# Patient Record
Sex: Female | Born: 1960 | Race: White | Hispanic: No | Marital: Married | Smoking: Never smoker
Health system: Southern US, Academic
[De-identification: ages and names within clinical notes are randomized; demographics above are authoritative.]

## PROBLEM LIST (undated history)

## (undated) DIAGNOSIS — K219 Gastro-esophageal reflux disease without esophagitis: Secondary | ICD-10-CM

## (undated) DIAGNOSIS — F419 Anxiety disorder, unspecified: Secondary | ICD-10-CM

## (undated) DIAGNOSIS — M8580 Other specified disorders of bone density and structure, unspecified site: Secondary | ICD-10-CM

## (undated) HISTORY — PX: HX BREAST BIOPSY: SHX20

## (undated) HISTORY — DX: Gastro-esophageal reflux disease without esophagitis: K21.9

## (undated) HISTORY — DX: Anxiety disorder, unspecified: F41.9

## (undated) HISTORY — DX: Other specified disorders of bone density and structure, unspecified site: M85.80

## (undated) HISTORY — PX: DILATION AND CURETTAGE OF UTERUS: SHX78

## (undated) HISTORY — PX: OTHER SURGICAL HISTORY: SHX169

---

## 1998-05-07 ENCOUNTER — Other Ambulatory Visit: Admission: RE | Admit: 1998-05-07 | Discharge: 1998-05-07 | Payer: Self-pay | Admitting: Gynecology

## 1999-06-02 ENCOUNTER — Other Ambulatory Visit: Admission: RE | Admit: 1999-06-02 | Discharge: 1999-06-02 | Payer: Self-pay | Admitting: Gynecology

## 1999-10-23 ENCOUNTER — Encounter: Payer: Self-pay | Admitting: Gynecology

## 1999-10-23 ENCOUNTER — Encounter: Admission: RE | Admit: 1999-10-23 | Discharge: 1999-10-23 | Payer: Self-pay | Admitting: Gynecology

## 2000-08-02 ENCOUNTER — Other Ambulatory Visit: Admission: RE | Admit: 2000-08-02 | Discharge: 2000-08-02 | Payer: Self-pay | Admitting: Gynecology

## 2001-08-31 ENCOUNTER — Other Ambulatory Visit: Admission: RE | Admit: 2001-08-31 | Discharge: 2001-08-31 | Payer: Self-pay | Admitting: Gynecology

## 2001-09-28 ENCOUNTER — Ambulatory Visit (HOSPITAL_COMMUNITY): Admission: RE | Admit: 2001-09-28 | Discharge: 2001-09-28 | Payer: Self-pay | Admitting: *Deleted

## 2001-09-28 ENCOUNTER — Encounter (INDEPENDENT_AMBULATORY_CARE_PROVIDER_SITE_OTHER): Payer: Self-pay | Admitting: *Deleted

## 2002-09-04 ENCOUNTER — Other Ambulatory Visit: Admission: RE | Admit: 2002-09-04 | Discharge: 2002-09-04 | Payer: Self-pay | Admitting: Gynecology

## 2003-11-19 ENCOUNTER — Other Ambulatory Visit: Admission: RE | Admit: 2003-11-19 | Discharge: 2003-11-19 | Payer: Self-pay | Admitting: Gynecology

## 2005-01-13 ENCOUNTER — Other Ambulatory Visit: Admission: RE | Admit: 2005-01-13 | Discharge: 2005-01-13 | Payer: Self-pay | Admitting: Gynecology

## 2005-06-23 ENCOUNTER — Encounter: Admission: RE | Admit: 2005-06-23 | Discharge: 2005-06-23 | Payer: Self-pay | Admitting: Family Medicine

## 2006-03-14 ENCOUNTER — Other Ambulatory Visit: Admission: RE | Admit: 2006-03-14 | Discharge: 2006-03-14 | Payer: Self-pay | Admitting: Gynecology

## 2008-04-04 ENCOUNTER — Other Ambulatory Visit: Admission: RE | Admit: 2008-04-04 | Discharge: 2008-04-04 | Payer: Self-pay | Admitting: Gynecology

## 2008-05-14 ENCOUNTER — Encounter: Payer: Self-pay | Admitting: Gynecology

## 2008-05-14 ENCOUNTER — Ambulatory Visit (HOSPITAL_BASED_OUTPATIENT_CLINIC_OR_DEPARTMENT_OTHER): Admission: RE | Admit: 2008-05-14 | Discharge: 2008-05-14 | Payer: Self-pay | Admitting: Gynecology

## 2008-05-14 HISTORY — PX: OTHER SURGICAL HISTORY: SHX169

## 2008-08-15 ENCOUNTER — Ambulatory Visit: Payer: Self-pay | Admitting: Gynecology

## 2010-01-14 ENCOUNTER — Ambulatory Visit: Payer: Self-pay | Admitting: Gynecology

## 2010-01-14 ENCOUNTER — Other Ambulatory Visit: Admission: RE | Admit: 2010-01-14 | Discharge: 2010-01-14 | Payer: Self-pay | Admitting: Gynecology

## 2010-07-02 ENCOUNTER — Encounter: Admission: RE | Admit: 2010-07-02 | Discharge: 2010-07-02 | Payer: Self-pay | Admitting: Internal Medicine

## 2010-07-23 ENCOUNTER — Ambulatory Visit (HOSPITAL_COMMUNITY): Admission: RE | Admit: 2010-07-23 | Discharge: 2010-07-23 | Payer: Self-pay | Admitting: Internal Medicine

## 2010-11-28 ENCOUNTER — Other Ambulatory Visit: Payer: Self-pay | Admitting: Internal Medicine

## 2010-11-28 DIAGNOSIS — N281 Cyst of kidney, acquired: Secondary | ICD-10-CM

## 2010-12-15 ENCOUNTER — Other Ambulatory Visit: Payer: Self-pay

## 2010-12-17 ENCOUNTER — Ambulatory Visit
Admission: RE | Admit: 2010-12-17 | Discharge: 2010-12-17 | Disposition: A | Discharge status: Home / Self Care | Payer: BC Managed Care – PPO | Source: Ambulatory Visit | Attending: Internal Medicine | Admitting: Internal Medicine

## 2010-12-17 DIAGNOSIS — N281 Cyst of kidney, acquired: Secondary | ICD-10-CM

## 2011-03-23 NOTE — Op Note (Signed)
Marie Robinson, Marie Robinson                 ACCOUNT NO.:  [id number]   MEDICAL RECORD NO.:  [id number]          PATIENT TYPE:  AMB   LOCATION:  [facility]                         FACILITY:  [facility]   PHYSICIAN:  Juan H. Lily Peer, M.D.DATE OF BIRTH:  1961/10/01   DATE OF PROCEDURE:  DATE OF DISCHARGE:                               OPERATIVE REPORT   INDICATIONS FOR OPERATION:  A 50 year old gravida 4, para 2, AB 2.   Routine ultrasound.  Was found to have intrauterine defect.  Sonohysterogram demonstrated what appeared to be either submucous myoma  or submucous endometrial polyp.   PREOPERATIVE DIAGNOSIS:  Submucous myoma/endometrial polyp.   POSTOPERATIVE DIAGNOSIS:  Endometrial polyp.   ANESTHESIA:  General endotracheal anesthesia.   PROCEDURE PERFORMED:  Resectoscopic polypectomy.   FINDINGS:  Normal cervical length with no intracervical lesions.  Both  tubal ostia were identified.  A small polyp was noted in the left  cornual region.  No other intracavitary defects were noted.   DESCRIPTION OF OPERATION:  After the patient was adequately counseled,  she was taken to the operating room where she underwent successful  general endotracheal anesthesia.  She had received cefoxitin 1 gram for  prophylaxis and also PSA stockings for DVT prophylaxis.  She was placed  ina low lithotomy position, laminaria that had previously been placed  intracervical was removed.  The cervix and the vagina was cleansed with  Betadine solution and the uterus sounded to approximately 7 cm.  Minimal  dilatation was required with a Pratt dilator due to the fact that she  had a laminaria placed night before.  The Karl/Storz operative  resectoscope with 9 degrees wire loop was inserted into the intrauterine  cavity 3% sorbitol with distending media.  After systematic inspection  of the intrauterine cavity and cervical canal with findings as described  above, the Valleylab electrical surgical generator was set at 70  watts  in the cutting mode and 70 anticoagulation mode and the endometrial  polyp had been identified in the left cornual region was excised at its  base and passed off the operative field for histological evaluation.  No  other abnormality was noted.  The single-toothed tenaculum was removed.  The patient was extubated and transferred to recovery with stable vital  signs.  Blood loss was minimal.  Fluid resuscitation consisted of 100 mL  of lactated Ringer's and 3% sorbitol fluid deficit was less than 50 mL.      Juan H. Lily Peer, M.D.  Electronically Signed     JHF/MEDQ  D:  05/14/2008  T:  05/14/2008  Job:  [id number]

## 2011-03-23 NOTE — H&P (Signed)
[name], Robinson                 ACCOUNT NO.:  [id number]   MEDICAL RECORD NO.:  [id number]         PATIENT TYPE:  HAMB   LOCATION:                               FACILITY:  [facility]   PHYSICIAN:  Juan H. Lily Peer, M.D.DATE OF BIRTH:  Aug 24, 1961   DATE OF ADMISSION:  05/14/2008  DATE OF DISCHARGE:                              HISTORY & PHYSICAL   The patient is scheduled for surgery on Tuesday, July 7, at 7:30 a.m. in  [facility].  Please have history and physical available.   CHIEF COMPLAINT:  Submucous myoma.   HISTORY:  The patient is a 50 year old gravida 4, para 2, AB2 was seen  in the office for annual gynecological examination on May 28.  The  patient had been menopausal since 2007, but was denying any vasomotor  symptoms.  She was on no hormone replacement therapy.  Her mother had  uterine cancer at the age of 54 as well as breast cancer.  The patient  had been complaining recently of some on and off left lower quadrant  discomfort and since she weighs 209 pounds, we had proceed with even an  ultrasound in the office.  The ultrasound had demonstrated a retroflexed  uterus difficult to assess her adnexa of the images located in the wall  of the endometrial cavity was questionable solid myoma with some  calcification measuring 13 mm x 9 mm.  We had also proceed with a  sonohysterogram to confirm that it was a submucous myoma and was  measuring 11 mm x 10 mm.  An endometrial biopsy had been done as well  which had demonstrated fragments of benign endometrial polyp.  The  patient is now scheduled to undergo resectoscopic polypectomy and/or  myomectomy for histological evaluation.  The patient denied any vaginal  bleeding, review of record also indicated that when she came for her  annual exam, vitamin D level had been obtained which had demonstrated  her value to be low at 27.  She will be placed on vitamin D 50,000 units  q. weekly for 12 weeks.  We will recheck  her vitamin D level a week  after her last vitamin D tablet and her recent Pap smear had been normal  as well and her mammogram had some fatty tissue obscuring some of the  views and she was asked to follow up with a mammogram in 6 months  especially of the right breast.   PAST MEDICAL HISTORY:  She has had two normal spontaneous vaginal  deliveries, two D & Cs.   MEDICATIONS:  Consist of multivitamins, calcium, Nexium, Lexapro 20 mg  daily, and Wellbutrin XR 150 mg q. daily.   MEDICAL CONDITIONS:  Gastroesophageal reflux disease and anxiety.   ALLERGIES:  She denies any allergies.   FAMILY HISTORY:  Mother and grandmother, history of diabetes and mother  with hypertension, mother at the age of 52 had breast cancer.  Mother  and grandmother, history of osteoporosis and uterine cancer described in  her mother at the age of 52.  The patient's form of contraception had  been a vasectomy that her husband had many years ago.   PHYSICAL EXAMINATION:  VITAL SIGNS: On physical examination, the patient  weighs 209 pounds and blood pressure was 142/86.  She is 5 feet 5-1/2  inches tall.  HEENT: Unremarkable.  NECK: Supple.  Trachea midline.  No carotid bruits or thyromegaly.  LUNGS: Clear to auscultation without rhonchi or wheezes.  HEART: Regular rate and rhythm.  No murmurs or gallop.  BREASTS: Not done today.  Her annual examination was normal.  ABDOMEN: Soft, nontender without rebound or guarding.  PELVIC: Bartholin, urethra, and Skene glands were within normal limits.  VAGINA AND CERVIX: No lesion or discharge.  UTERUS AND ADNEXA: Difficult to evaluate due to the patient's abdominal  girth and weight of 209 pounds.  This was the reason for the ultrasound  having been done to begin with, see history above.  RECTAL EXAM: Unremarkable.   ASSESSMENT:  A 50 year old gravida 4, para 2, AB2 with an incidental  finding of submucous polyp/myoma at the time of assessing her adnexa for   pelvic pain.  The pain is resolved.  The ultrasound demonstrated normal-  appearing ovaries but a submucous polyp/myoma.  Her endometrial biopsy  had demonstrated fragments of benign endometrial polyp.  The patient  will undergo resectoscopic polypectomy/myomectomy on Tuesday, July 7.  Today, she was seen in the office for preoperative consultation and also  had a laminaria placed intracervically to facilitate insertion of the  operative resectoscope at the time of surgery tomorrow.  The risk of the  operation include infection, which she will receive prophylactic  antibiotic.  For DVT prophylaxis, she will have PSA stocking.  We  discussed about the potential risk for fluid overload and pulmonary  edema or perforation of the uterus from the operative resectoscope  requiring emergency surgery.  Also, the event of uncontrollable  hemorrhages, she would need a blood transfusion, the risk of  anaphylactic reactions, hepatitis, and AIDS.  All these issues were  discussed in detail.  All questions were answered and we will follow  accordingly.   PLAN:  The patient is scheduled for resectoscopic polypectomy/myomectomy  on Tuesday, July 7, at [facility].  Please have history  and physical available.      Juan H. Lily Peer, M.D.  Electronically Signed     JHF/MEDQ  D:  05/13/2008  T:  05/13/2008  Job:  [id number]

## 2011-05-28 ENCOUNTER — Encounter: Payer: BC Managed Care – PPO | Admitting: Gynecology

## 2011-06-01 ENCOUNTER — Encounter: Payer: Self-pay | Admitting: Anesthesiology

## 2011-06-15 ENCOUNTER — Encounter: Payer: Self-pay | Admitting: Gynecology

## 2011-06-15 ENCOUNTER — Other Ambulatory Visit (HOSPITAL_COMMUNITY)
Admission: RE | Admit: 2011-06-15 | Discharge: 2011-06-15 | Disposition: A | Discharge status: Home / Self Care | Payer: BC Managed Care – PPO | Source: Ambulatory Visit | Attending: Gynecology | Admitting: Gynecology

## 2011-06-15 ENCOUNTER — Ambulatory Visit (INDEPENDENT_AMBULATORY_CARE_PROVIDER_SITE_OTHER): Payer: BC Managed Care – PPO | Admitting: Gynecology

## 2011-06-15 VITALS — BP 130/74 | Ht 65.5 in | Wt 207.0 lb

## 2011-06-15 DIAGNOSIS — L570 Actinic keratosis: Secondary | ICD-10-CM | POA: Insufficient documentation

## 2011-06-15 DIAGNOSIS — M899 Disorder of bone, unspecified: Secondary | ICD-10-CM

## 2011-06-15 DIAGNOSIS — N951 Menopausal and female climacteric states: Secondary | ICD-10-CM | POA: Insufficient documentation

## 2011-06-15 DIAGNOSIS — M949 Disorder of cartilage, unspecified: Secondary | ICD-10-CM

## 2011-06-15 DIAGNOSIS — Z01419 Encounter for gynecological examination (general) (routine) without abnormal findings: Secondary | ICD-10-CM | POA: Insufficient documentation

## 2011-06-15 DIAGNOSIS — M8580 Other specified disorders of bone density and structure, unspecified site: Secondary | ICD-10-CM

## 2011-06-15 DIAGNOSIS — Z1211 Encounter for screening for malignant neoplasm of colon: Secondary | ICD-10-CM

## 2011-06-15 DIAGNOSIS — Z780 Asymptomatic menopausal state: Secondary | ICD-10-CM

## 2011-06-15 LAB — HEMOCCULT GUIAC POC 1CARD (OFFICE)

## 2011-06-15 NOTE — Patient Instructions (Signed)
Marie Robinson,  Please remember to check with your mother if she has been tested for the BRCA one, BRCA2 breast cancer gene mutation and leg me know incision find out. Will need to schedule a bone density study next one to 2 weeks here in the office. We have requested a copy of your mammogram 12 care or records. Impression number to continue to do monthly self breast examinations.

## 2011-06-15 NOTE — Progress Notes (Signed)
Addended by: Landis Martins R on: 06/15/2011 12:42 PM   Modules accepted: Orders

## 2011-06-15 NOTE — Progress Notes (Signed)
Marie Robinson 12-31-1960 [id number]   History:    50 y.o.  for annual exam with complaint of some skin manifestations which showed appearance and according to her primary dermatologist or senile keratosis. She also has been followed by Dr. Osie Bond for her nonproductive cough in the evenings she has a followup 4 with him in 2 weeks or bile her labs will be drawn at that time. Review of her record also indicated that her mother had breast cancer and I have been recommending that the patient check with her mother to see if she was screened or could be screened for the BRCA1 and BRCA2 breast cancer gene mutation. Review record indicates that she was weighing 193 pounds she is now weighing 207 pounds. Her last mammogram was in June of 2012 and she is doing her monthly self breast examination. 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 Her last colonoscopy was in 2000 and benign polyps were noted. Her husband has had vasectomy. She is taking calcium with vitamin D. She has a history vitamin D deficiency in the past.  Past medical history,surgical history, family history and social history were all reviewed and documented in the EPIC chart. ROS:  Was performed and pertinent positives and negatives are included in the history.  Exam: chaperone present Filed Vitals:   06/15/11 1010  BP: 130/74   Body mass index is 33.92 kg/(m^2).  General appearance : Well developed well nourished female. Skin grossly normal HEENT: Neck supple, trachea midline Lungs: Clear to auscultation, no rhonchi or wheezes Heart: Regular rate and rhythm, no murmurs or gallops Breast:Examined in sitting and supine position were symmetrical in appearance, no palpable masses, to skin retraction, no nipple inversion, no nipple discharge and no axillary or supraclavicular lymphadenopathy Abdomen: no palpable masses or tenderness Pelvic  Ext/BUS/vagina   normal   Cervix  normal   Uterus  anteverted, normal size, shape and contour, midline and mobile nontender   Adnexa  Without masses or tenderness  Anus and perineum  normal   Rectovaginal  normal sphincter tone without palpated masses or tenderness. Hemoccult testing done   Assessment/Plan:  50 y.o. 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 We'll schedule a bone density study next 1-2 weeks. Will notify her of there is any amount the of the Pap smear or Hemoccult testing. She will follow up with Dr. Ricki Miller for her labs and a couple weeks and requested that we get a copy as well to of your records as well for pain a copy of her last nomogram which was done in June of this year. We'll wait for her to let us know in reference to her mother's breast cancer history in the BRCA1 and BRCA2 testing and manage accordingly. All questions were answered we'll follow accordingly.    Ok Edwards MD, 11:15 AM 06/15/2011

## 2011-06-22 ENCOUNTER — Ambulatory Visit (INDEPENDENT_AMBULATORY_CARE_PROVIDER_SITE_OTHER): Payer: BC Managed Care – PPO | Admitting: *Deleted

## 2011-06-22 DIAGNOSIS — M899 Disorder of bone, unspecified: Secondary | ICD-10-CM

## 2011-06-22 DIAGNOSIS — Z780 Asymptomatic menopausal state: Secondary | ICD-10-CM

## 2011-06-22 DIAGNOSIS — Z13820 Encounter for screening for osteoporosis: Secondary | ICD-10-CM

## 2011-06-22 DIAGNOSIS — M8580 Other specified disorders of bone density and structure, unspecified site: Secondary | ICD-10-CM

## 2011-06-22 NOTE — Progress Notes (Signed)
BONE DENSITY PERFORMED

## 2011-12-02 ENCOUNTER — Other Ambulatory Visit: Payer: Self-pay | Admitting: Internal Medicine

## 2011-12-02 DIAGNOSIS — N281 Cyst of kidney, acquired: Secondary | ICD-10-CM

## 2011-12-10 ENCOUNTER — Ambulatory Visit
Admission: RE | Admit: 2011-12-10 | Discharge: 2011-12-10 | Disposition: A | Discharge status: Home / Self Care | Payer: BC Managed Care – PPO | Source: Ambulatory Visit | Attending: Internal Medicine | Admitting: Internal Medicine

## 2011-12-10 DIAGNOSIS — N281 Cyst of kidney, acquired: Secondary | ICD-10-CM

## 2012-07-26 ENCOUNTER — Encounter: Payer: Self-pay | Admitting: Gynecology

## 2012-08-07 ENCOUNTER — Encounter: Payer: BC Managed Care – PPO | Admitting: Gynecology

## 2012-11-15 ENCOUNTER — Ambulatory Visit (INDEPENDENT_AMBULATORY_CARE_PROVIDER_SITE_OTHER): Payer: BC Managed Care – PPO | Admitting: Gynecology

## 2012-11-15 ENCOUNTER — Encounter: Payer: Self-pay | Admitting: Gynecology

## 2012-11-15 VITALS — BP 136/90 | Ht 66.0 in | Wt 217.0 lb

## 2012-11-15 DIAGNOSIS — Z01419 Encounter for gynecological examination (general) (routine) without abnormal findings: Secondary | ICD-10-CM

## 2012-11-15 DIAGNOSIS — R635 Abnormal weight gain: Secondary | ICD-10-CM

## 2012-11-15 DIAGNOSIS — I10 Essential (primary) hypertension: Secondary | ICD-10-CM | POA: Insufficient documentation

## 2012-11-15 DIAGNOSIS — N952 Postmenopausal atrophic vaginitis: Secondary | ICD-10-CM

## 2012-11-15 NOTE — Patient Instructions (Addendum)
Patient information: Atrophic vaginitis (The Basics)View in SpanishWritten by the doctors and editors at [employer]ToDate  What is atrophic vaginitis? - Atrophic vaginitis is a condition that causes the vagina and tissues near the vagina to get dry, thin, and inflamed. This can be uncomfortable or make sex painful. Atrophic vaginitis happens when a woman does not make enough of a hormone called estrogen. This condition mainly affects women who have been through menopause (meaning they have stopped having a monthly period). It can also happen to women whose ovaries were removed, who are taking certain medicines, or who are nursing.  What are the symptoms of atrophic vaginitis? - The symptoms include: Vaginal dryness  Vaginal burning or irritation  Making less lubrication during sex  Pain during sex  Bleeding when something touches or rubs the vagina, for example after sex. (If you have this symptom, be sure to see a doctor.)  Vaginal discharge (leaking fluid from the vagina)  Urinary problems, such as having to urinate often, having pain with urination, or having blood in the urine. (If you have these symptoms, be sure to see a doctor.)  Some women never tell their doctor they are having symptoms of atrophic vaginitis. Often they are embarrassed or think the symptoms are a normal part of aging. If you have symptoms of this condition, and they bother you, mention it to your doctor or nurse. There are treatments that can help.  Is there a test for atrophic vaginitis? - No. There is no test. But your doctor or nurse should be able to tell if you have it by learning about your symptoms and doing an exam. Is there anything I can do on my own to feel better? - Yes. Some women feel better if they use lubricants before sex and use a vaginal moisturizer, such as Replens or Lubrin, several times a week. Vaginal moisturizers are not the same as lubricants. They help keep the vagina moist all the time, not just during  sex. Should I see a doctor or nurse? - See your doctor or nurse if you have symptoms of atrophic vaginitis and they bother you.  How is atrophic vaginitis treated? - The most effective treatment for atrophic vaginitis is the hormone estrogen. When using estrogen to treat atrophic vaginitis, doctors recommend "vaginal estrogen." Vaginal estrogen is any form of estrogen that goes directly into the vagina. It comes in creams, tablets, or a flexible ring. Vaginal estrogen comes in small doses that don't increase the levels of estrogen in other parts of the body very much. Estrogen also comes in a pill that you swallow or a skin patch, but vaginal estrogen is better for treating symptoms of atrophic vaginitis. Some women who take vaginal estrogen must also take another hormone, called progesterone.  If you want to take estrogen, ask your doctor or nurse what the possible risks and benefits are for you. If you have a history of breast cancer, ask whether hormones are safe for you. If you prefer to treat vaginal atrophy with a pill rather than a vaginal medicine, there is a medicine that comes in pill form and helps relieve the symptoms of vaginal atrophy. This medicine, called ospemifene (brand name: Arna Snipe), is similar to estrogen but is not estrogen. It can cause hot flashes.   Ospemifene: Patient drug information   Access [employer] here.  Copyright 925 [id number] [id number] [employer] All rights reserved.  (For additional information see "Ospemifene: Drug information") Brand Names: [place]  Osphena Warning  . VWU:JWJX:B147:[id number] Estrogens  may raise the chance of uterine cancer. Progestins may lower this chance. A warning sign for cancer of the uterus is vaginal bleeding. Report any vaginal bleeding to your doctor.  . ZOX:WRUE:A540:J8119147 This drug may raise your chance of having blood clots, a stroke, or a heart attack. Talk with your doctor. What is this drug used for?  . WGN:FAOZ:H086:V7846962 It is  used to treat vaginal pain during sex caused by change of life. What do I need to tell my doctor BEFORE I take this drug?  . XBM:WUXL:K440:N0272536 If you have an allergy to ospemifene or any other part of this drug.  . UYQ:IHKV:Q259:D6387564 If you are allergic to any drugs like this one, any other drugs, foods, or other substances. Tell your doctor about the allergy and what signs you had, like rash; hives; itching; shortness of breath; wheezing; cough; swelling of face, lips, tongue, or throat; or any other signs.  . PPI:RJJO:A416:S0630160 If you are pregnant or may be pregnant. Do not take this drug if you are pregnant.  . FUX:NATF:T732:K0254270 If you are taking any of these drugs: Estrogens, fluconazole, or rifampin.  . WCB:JSEG:B151:V6160737 If you have any of these health problems: Tumor where estrogen makes it grow or vaginal bleeding.  . TGG:YIRS:W546:E7035009 If you have ever had any of these health problems: Blood clots, cancer, stroke, or heart attack.  FGH:WEXH:B716:R6789381 This is not a list of all drugs or health problems that interact with this drug.  OFB:PZWC:H852:D7824235 Tell your doctor and pharmacist about all of your drugs (prescription or OTC, natural products, vitamins) and health problems. You must check to make sure that it is safe for you to take this drug with all of your drugs and health problems. Do not start, stop, or change the dose of any drug without checking with your doctor. What are some things I need to know or do while I take this drug?  . TIR:WERX:V400:Q6761950 Tell dentists, surgeons, and other doctors that you use this drug.  . DTO:IZTI:W580:D9833825 If you are having surgery, talk with your doctor.  . KNL:ZJQB:H419:F7902409 Tell your doctor if you are breast-feeding. You will need to talk about any risks to your baby.  . BDZ:HGDJ:M426:S3419622 If you think there has been an overdose, call your poison control center or get medical care right away. Be ready to  tell or show what was taken, how much, and when it happened. What are some side effects that I need to call my doctor about right away?  WLN:LGXQ:J194:R7408144 WARNING/CAUTION: Even though it may be rare, some people may have very bad and sometimes deadly side effects when taking a drug. Tell your doctor or get medical help right away if you have any of the following signs or symptoms that may be related to a very bad side effect:  . YJE:HUDJ:S970:Y6378588 Signs of an allergic reaction, like rash; hives; itching; red, swollen, blistered, or peeling skin with or without fever; wheezing; tightness in the chest or throat; trouble breathing or talking; unusual hoarseness; or swelling of the mouth, face, lips, tongue, or throat.  . FOY:DXAJ:O878:M7672094 Chest pain.  . BSJ:GGEZ:M629:U7654650 Shortness of breath.  . PTW:SFKC:L275:T7001749 Change in strength on 1 side is greater than the other, trouble speaking or thinking, change in balance, or blurred eyesight.  . SWH:QPRF:F638:G6659935 Feeling very tired or weak.  . TSV:XBLT:J030:S9233007 Very bad headache.  . MAU:QJFH:L456:Y5638937 Swelling, warmth, or pain in the leg or arm.  . DSK:AJGO:T157:W6203559 Very bad vaginal bleeding. What are some other side effects of  this drug?  ZOX:WRUE:A540:J8119147 All drugs may cause side effects. However, many people have no side effects or only have minor side effects. Call your doctor or get medical help if any of these side effects or any other side effects bother you or do not go away:  . WGN:FAOZ:H086:V7846962 Hot flashes.  . XBM:WUXL:K440:N0272536 Vaginal discharge.  . UYQ:IHKV:Q259:D6387564 Muscle spasm.  . PPI:RJJO:A416:S0630160 Sweating a lot.  FUX:NATF:T732:K0254270 These are not all of the side effects that may occur. If you have questions about side effects, call your doctor. Call your doctor for medical advice about side effects.  WCB:JSEG:B151:V6160737 You may report side effects to your national health  agency. How is this drug best taken?  . TGG:YIRS:W546:E7035009 Use this drug as ordered by your doctor. Read and follow the dosing on the label closely.  . FGH:WEXH:B716:R6789381 Take with food. What do I do if I miss a dose?  . OFB:PZWC:H852:D7824235 Take a missed dose as soon as you think about it.  . TIR:WERX:V400:Q6761950 If it is close to the time for your next dose, skip the missed dose and go back to your normal time.  . DTO:IZTI:W580:D9833825 Do not take 2 doses at the same time or extra doses. How do I store and/or throw out this drug?  . KNL:ZJQB:H419:F7902409 Store at room temperature.  . BDZ:HGDJ:M426:S3419622 Store in a dry place. Do not store in a bathroom.  . WLN:LGXQ:J194:R7408144 Keep all drugs out of the reach of children and pets.  . YJE:HUDJ:S970:Y6378588 Check with your pharmacist about how to throw out unused drugs.  General drug facts  . FOY:DXAJ:O878:M7672094 If your symptoms or health problems do not get better or if they become worse, call your doctor.  . BSJ:GGEZ:M629:U7654650 Do not share your drugs with others and do not take anyone else's drugs.  . PTW:SFKC:L275:T7001749 Keep a list of all your drugs (prescription, natural products, vitamins, OTC) with you. Give this list to your doctor.  . SWH:QPRF:F638:G6659935 Talk with the doctor before starting any new drug, including prescription or OTC, natural products, or vitamins.  . TSV:XBLT:J030:S9233007 Some drugs may have another patient information leaflet. If you have any questions about this drug, please talk with your doctor, pharmacist, or other health care provider.

## 2012-11-15 NOTE — Progress Notes (Addendum)
Marie Robinson [birth year] [id number]   History:    52 y.o.  for annual gyn exam who is only complaint today is vaginal dryness and irritation especially during intercourse. She denies any vasomotor symptoms. She has been menopausal since 2007 and her husband has had a vasectomy. Dr.Pang is her primary physician who is been monitoring her hypertension. Her blood pressure was slightly elevated on arrival since she just took for her hypertension medication. Patient's last Pap smear was normal in 2012. Her last bone density study was in 2012 her lowest T score was at the right femoral neck with a value of -1.5 (decreased bone mineralization/osteopenia) with normal Frax indices.  Review of her record also indicated that her mother had breast cancer and I have been recommending that the patient check with her mother to see if she was screened or could be screened for the BRCA1 and BRCA2 breast cancer gene mutation. Patient does her monthly self breast examination and her mammogram was normal in 2013. Patient does her monthly self breast examination. Her last colonoscopy was in 2010 by Dr. Matthias Hughs who had removed some benign polyps.   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  Past medical history,surgical history, family history and social history were all reviewed and documented in the EPIC chart.  Gynecologic History Patient's last menstrual period was 06/14/2008. Contraception: post menopausal status Last Pap: 2012. Results were: normal Last mammogram: 2013. Results were: normal  Obstetric History OB History    Grav Para Term Preterm Abortions TAB SAB Ect Mult Living   4 2   2  2   2      # Outc Date GA Lbr Len/2nd Wgt Sex Del Anes PTL Lv   1 PAR            2 PAR            3 SAB            4 SAB                ROS: A ROS was performed and pertinent positives and negatives are included in the history.  GENERAL: No fevers or  chills. HEENT: No change in vision, no earache, sore throat or sinus congestion. NECK: No pain or stiffness. CARDIOVASCULAR: No chest pain or pressure. No palpitations. PULMONARY: No shortness of breath, cough or wheeze. GASTROINTESTINAL: No abdominal pain, nausea, vomiting or diarrhea, melena or bright red blood per rectum. GENITOURINARY: No urinary frequency, urgency, hesitancy or dysuria. MUSCULOSKELETAL: No joint or muscle pain, no back pain, no recent trauma. DERMATOLOGIC: No rash, no itching, no lesions. ENDOCRINE: No polyuria, polydipsia, no heat or cold intolerance. No recent change in weight. HEMATOLOGICAL: No anemia or easy bruising or bleeding. NEUROLOGIC: No headache, seizures, numbness, tingling or weakness. PSYCHIATRIC: No depression, no loss of interest in normal activity or change in sleep pattern.     Exam: chaperone present  BP 136/90  Ht 5\' 6"  (1.676 m)  Wt 217 lb (98.431 kg)  BMI 35.02 kg/m2  LMP 06/14/2008  Body mass index is 35.02 kg/(m^2).  General appearance : Well developed well nourished female. No acute distress HEENT: Neck supple, trachea midline, no carotid bruits, no thyroidmegaly Lungs: Clear to auscultation, no rhonchi or wheezes, or rib retractions  Heart: Regular rate and rhythm, no murmurs or gallops Breast:Examined in sitting and supine position were symmetrical in appearance, no palpable masses or tenderness,  no skin  retraction, no nipple inversion, no nipple discharge, no skin discoloration, no axillary or supraclavicular lymphadenopathy Abdomen: no palpable masses or tenderness, no rebound or guarding Extremities: no edema or skin discoloration or tenderness  Pelvic:  Bartholin, Urethra, Skene Glands: Within normal limits             Vagina: No gross lesions or discharge, atrophy  Cervix: No gross lesions or discharge  Uterus  anteverted, normal size, shape and consistency, non-tender and mobile  Adnexa  Without masses or tenderness  Anus and  perineum  normal   Rectovaginal  normal sphincter tone without palpated masses or tenderness             Hemoccult cards provided     Assessment/Plan:  52 y.o. 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 Patient is somewhat hesitant not using any form of estrogen. We had discussed the Vagifem 10 mcg to apply twice a week or probiotic gel such as refresh or Luvena. We also discussed nonhormonal treatment for a vaginal atrophy such as with Osphena 60 mg which she can take 1 by mouth daily. The risk benefits and pros and cons were discussed and literature information was provided her to read on the products as well. No labs done today. No Pap smear done today. New guidelines discussed. Hemoccult cards provided for the patient to submit to the office for testing. We discussed importance of regular exercise as well as calcium and vitamin D for osteoporosis prevention.    Ok Edwards MD, 1:17 PM 11/15/2012

## 2012-11-27 ENCOUNTER — Encounter: Payer: Self-pay | Admitting: Gynecology

## 2012-12-25 DIAGNOSIS — Z1211 Encounter for screening for malignant neoplasm of colon: Secondary | ICD-10-CM

## 2012-12-26 ENCOUNTER — Other Ambulatory Visit: Payer: Self-pay | Admitting: Gynecology

## 2012-12-26 ENCOUNTER — Other Ambulatory Visit: Payer: Self-pay | Admitting: Anesthesiology

## 2012-12-26 DIAGNOSIS — Z1211 Encounter for screening for malignant neoplasm of colon: Secondary | ICD-10-CM

## 2013-08-27 ENCOUNTER — Encounter: Payer: Self-pay | Admitting: Gynecology

## 2014-09-09 ENCOUNTER — Encounter: Payer: Self-pay | Admitting: Gynecology

## 2014-09-10 ENCOUNTER — Encounter: Payer: Self-pay | Admitting: Gynecology

## 2014-09-18 ENCOUNTER — Encounter: Payer: Self-pay | Admitting: Gynecology

## 2014-11-26 ENCOUNTER — Other Ambulatory Visit: Payer: Self-pay | Admitting: Dermatology

## 2015-03-13 ENCOUNTER — Encounter: Payer: Self-pay | Admitting: Gynecology

## 2015-03-13 ENCOUNTER — Other Ambulatory Visit (HOSPITAL_COMMUNITY)
Admission: RE | Admit: 2015-03-13 | Discharge: 2015-03-13 | Disposition: A | Discharge status: Home / Self Care | Payer: BC Managed Care – PPO | Source: Ambulatory Visit | Attending: Gynecology | Admitting: Gynecology

## 2015-03-13 ENCOUNTER — Ambulatory Visit (INDEPENDENT_AMBULATORY_CARE_PROVIDER_SITE_OTHER): Payer: BC Managed Care – PPO | Admitting: Gynecology

## 2015-03-13 VITALS — BP 130/80 | Ht 66.0 in | Wt 215.0 lb

## 2015-03-13 DIAGNOSIS — Z780 Asymptomatic menopausal state: Secondary | ICD-10-CM

## 2015-03-13 DIAGNOSIS — Z01419 Encounter for gynecological examination (general) (routine) without abnormal findings: Secondary | ICD-10-CM | POA: Diagnosis not present

## 2015-03-13 DIAGNOSIS — M8580 Other specified disorders of bone density and structure, unspecified site: Secondary | ICD-10-CM

## 2015-03-13 NOTE — Addendum Note (Signed)
Addended by: Kem ParkinsonBARNES, Li Bobo on: 03/13/2015 02:02 PM   Modules accepted: Orders

## 2015-03-13 NOTE — Patient Instructions (Signed)

## 2015-03-13 NOTE — Progress Notes (Signed)
Marie Robinson 23-Nov-1960 [id number]   History:    54 y.o.  for annual gyn exam with no complaints today. Patient denies any vasomotor symptoms. She has been menopausal since 2007. Her husband has had a vasectomy.Dr.Pang is her primary physician who is been doing her blood work and monitoring her hypertension.her blood pressure today was 130/80.patient with no past history of abnormal Pap smears.Her last bone density study was in 2012 her lowest T score was at the right femoral neck with a value of -1.5 (decreased bone mineralization/osteopenia) with normal Frax indices.  Review of her record also indicated that her mother had breast cancer and I have been recommending that the patient check with her mother to see if she was screened or could be screened for the BRCA1 and BRCA2 breast cancer gene mutation. Patient does her monthly self breast examination and her mammogram was normal in 2013. Patient does her monthly self breast examination. Her last colonoscopy was in 2010 by Dr. 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   Past medical history,surgical history, family history and social history were all reviewed and documented in the EPIC chart.  Gynecologic History Patient's last menstrual period was 06/14/2008. Contraception: post menopausal status Last Pap: 2012. Results were: normal Last mammogram: 2015. Results were: normal  Obstetric History OB History  Gravida Para Term Preterm AB SAB TAB Ectopic Multiple Living  4 2   2 2    2     # Outcome Date GA Lbr Len/2nd Weight Sex Delivery Anes PTL Lv  4 SAB           3 SAB           2 Para           1 Para                ROS: A ROS was performed and pertinent positives and negatives are included in the history.  GENERAL: No fevers or chills. HEENT: No change in vision, no earache, sore throat or sinus congestion. NECK: No pain  or stiffness. CARDIOVASCULAR: No chest pain or pressure. No palpitations. PULMONARY: No shortness of breath, cough or wheeze. GASTROINTESTINAL: No abdominal pain, nausea, vomiting or diarrhea, melena or bright red blood per rectum. GENITOURINARY: No urinary frequency, urgency, hesitancy or dysuria. MUSCULOSKELETAL: No joint or muscle pain, no back pain, no recent trauma. DERMATOLOGIC: No rash, no itching, no lesions. ENDOCRINE: No polyuria, polydipsia, no heat or cold intolerance. No recent change in weight. HEMATOLOGICAL: No anemia or easy bruising or bleeding. NEUROLOGIC: No headache, seizures, numbness, tingling or weakness. PSYCHIATRIC: No depression, no loss of interest in normal activity or change in sleep pattern.     Exam: chaperone present  BP 130/80 mmHg  Ht 5' 6"  (1.676 m)  Wt 215 lb (97.523 kg)  BMI 34.72 kg/m2  LMP 06/14/2008  Body mass index is 34.72 kg/(m^2).  General appearance : Well developed well nourished female. No acute distress HEENT: Eyes: no retinal hemorrhage or exudates,  Neck supple, trachea midline, no carotid bruits, no thyroidmegaly Lungs: Clear to auscultation, no rhonchi or wheezes, or rib retractions  Heart: Regular rate and rhythm, no murmurs or gallops Breast:Examined in sitting and supine position were symmetrical in appearance, no palpable masses or tenderness,  no skin retraction, no nipple inversion, no nipple discharge, no skin discoloration, no axillary or supraclavicular lymphadenopathy Abdomen:  no palpable masses or tenderness, no rebound or guarding Extremities: no edema or skin discoloration or tenderness  Pelvic:  Bartholin, Urethra, Skene Glands: Within normal limits             Vagina: No gross lesions or discharge  Cervix: No gross lesions or discharge  Uterus  anteverted, normal size, shape and consistency, non-tender and mobile  Adnexa  Without masses or tenderness  Anus and perineum  normal   Rectovaginal  normal sphincter tone without  palpated masses or tenderness             Hemoccult cards will be provided     Assessment/Plan:  54 y.o. female for annual exam who is menopausal with no vasomotor symptoms using over-the-counter lubricants during intercourse. Her PCP Dr.Pang has been doing her blood work. She is going to contact her gastroenterologist Dr. Cristina Gong to check on her next colonoscopy since it was 6 years ago and benign polyps had been removed. I provided her with fecal Hemoccult cards to submit to the office for testing. She is due for her mammogram at the end of the month. She is overdue for bone density study she will schedule it here in our office in the next few weeks. She was reminded of the importance of calcium vitamin D and regular exercise for osteoporosis prevention.Pap smear was obtained today.  Terrance Mass MD, 12:13 PM 03/13/2015

## 2015-03-17 LAB — CYTOLOGY - PAP

## 2015-04-10 ENCOUNTER — Ambulatory Visit (INDEPENDENT_AMBULATORY_CARE_PROVIDER_SITE_OTHER): Payer: BC Managed Care – PPO

## 2015-04-10 ENCOUNTER — Other Ambulatory Visit: Payer: Self-pay | Admitting: Gynecology

## 2015-04-10 DIAGNOSIS — Z13820 Encounter for screening for osteoporosis: Secondary | ICD-10-CM

## 2015-04-10 DIAGNOSIS — M8580 Other specified disorders of bone density and structure, unspecified site: Secondary | ICD-10-CM

## 2015-11-21 ENCOUNTER — Encounter: Payer: Self-pay | Admitting: Gynecology

## 2016-03-15 ENCOUNTER — Ambulatory Visit (INDEPENDENT_AMBULATORY_CARE_PROVIDER_SITE_OTHER): Payer: BC Managed Care – PPO | Admitting: Gynecology

## 2016-03-15 ENCOUNTER — Encounter: Payer: Self-pay | Admitting: Gynecology

## 2016-03-15 VITALS — BP 136/88 | Ht 66.0 in | Wt 217.8 lb

## 2016-03-15 DIAGNOSIS — Z01419 Encounter for gynecological examination (general) (routine) without abnormal findings: Secondary | ICD-10-CM | POA: Diagnosis not present

## 2016-03-15 NOTE — Progress Notes (Signed)
Marie Robinson [birth year] [id number]   History:    55 y.o.  for annual gyn exam with no complaints today. Dr. Shelia Media  is her PCP who has done her blood work in October but we have not received a copy of the results. Patient with no past history of any abnormal Pap smears..Her last bone density study was in 2012 her lowest T score was at the right femoral neck with a value of -1.5 (decreased bone mineralization/osteopenia) with normal Frax indices.  Review of her record also indicated that her mother had breast cancer and I have been recommending that the patient check with her mother to see if she was screened or could be screened for the BRCA1 and BRCA2 breast cancer gene mutation. Patient does her monthly self breast examination and her mammogram was normal in 2013. Patient does her monthly self breast examination. Her last colonoscopy was in 2010 by Dr. Cristina Gong who had removed some benign polyps she is scheduled for a colonoscopy later next month.  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 She is not on any hormonal replacement therapy..   Past medical history,surgical history, family history and social history were all reviewed and documented in the EPIC chart.  Gynecologic History Patient's last menstrual period was 06/14/2008. Contraception: post menopausal status Last Pap:2016esults were: normal Last mammogram:2017esults were: normal  Obstetric History OB History  Gravida Para Term Preterm AB SAB TAB Ectopic Multiple Living  _0 # Outcome Date GA Lbr Len/2nd Weight Sex Delivery Anes PTL Lv  4 SAB           3 SAB           2 Para           1 Para                ROS: A ROS was performed and pertinent positives and negatives are included in the history.  GENERAL: No fevers or chills. HEENT: No change in vision, no earache,  sore throat or sinus congestion. NECK: No pain or stiffness. CARDIOVASCULAR: No chest pain or pressure. No palpitations. PULMONARY: No shortness of breath, cough or wheeze. GASTROINTESTINAL: No abdominal pain, nausea, vomiting or diarrhea, melena or bright red blood per rectum. GENITOURINARY: No urinary frequency, urgency, hesitancy or dysuria. MUSCULOSKELETAL: No joint or muscle pain, no back pain, no recent trauma. DERMATOLOGIC: No rash, no itching, no lesions. ENDOCRINE: No polyuria, polydipsia, no heat or cold intolerance. No recent change in weight. HEMATOLOGICAL: No anemia or easy bruising or bleeding. NEUROLOGIC: No headache, seizures, numbness, tingling or weakness. PSYCHIATRIC: No depression, no loss of interest in normal activity or change in sleep pattern.     Exam: chaperone present  BP 136/88 mmHg  Ht _1  (1.676 m)  Wt 217 lb 12.8 oz (98.793 kg)  BMI 35.17 kg/m2  LMP 06/14/2008  Body mass index is 35.17 kg/(m^2).  General appearance : Well developed well nourished female. No acute distress HEENT: Eyes: no retinal hemorrhage or exudates,  Neck supple, trachea midline, no carotid bruits, no thyroidmegaly Lungs: Clear to auscultation, no rhonchi or wheezes, or rib retractions  Heart: Regular rate and rhythm, no murmurs or gallops Breast:Examined in sitting and supine position were symmetrical in appearance, no palpable masses or tenderness,  no skin retraction,  no nipple inversion, no nipple discharge, no skin discoloration, no axillary or supraclavicular lymphadenopathy Abdomen: no palpable masses or tenderness, no rebound or guarding Extremities: no edema or skin discoloration or tenderness  Pelvic:  Bartholin, Urethra, Skene Glands: Within normal limits             Vagina: No gross lesions or discharge  Cervix: No gross lesions or discharge  Uterus antevertednormal size, shape and consistency, non-tender and mobile  Adnexa  Without masses or tenderness  Anus and perineum   normal   Rectovaginal  normal sphincter tone without palpated masses or tenderness             Hemoccult Not indicated  ssment/Plan:  55 y.o. female for annual exammenopausal who is not no hormone replacement therapy. She is using over-the-counter lubricant during intercourse. She is taking her calcium with vitamin D. She is not due for bone density until next year. Her breast tenderness at times may be attributed to the fact that she drinks 3-4 cups of coffee a day we discussed importance of decreasing her caffeine intake. Pap smear not indicated this year. We are requesting a copy of her labs from her PCP which she will request. Pap smear not done this year according to the new guidelines.    Terrance Mass MD, 9:35 AM 03/15/2016

## 2016-12-02 ENCOUNTER — Encounter: Payer: Self-pay | Admitting: Gynecology

## 2017-03-21 ENCOUNTER — Encounter: Payer: Self-pay | Admitting: Gynecology

## 2017-03-21 ENCOUNTER — Ambulatory Visit (INDEPENDENT_AMBULATORY_CARE_PROVIDER_SITE_OTHER): Payer: BC Managed Care – PPO | Admitting: Gynecology

## 2017-03-21 VITALS — BP 120/82 | Ht 66.25 in | Wt 183.4 lb

## 2017-03-21 DIAGNOSIS — M8580 Other specified disorders of bone density and structure, unspecified site: Secondary | ICD-10-CM

## 2017-03-21 DIAGNOSIS — Z780 Asymptomatic menopausal state: Secondary | ICD-10-CM

## 2017-03-21 DIAGNOSIS — Z01419 Encounter for gynecological examination (general) (routine) without abnormal findings: Secondary | ICD-10-CM | POA: Diagnosis not present

## 2017-03-21 NOTE — Progress Notes (Signed)
  Marie Robinson 04/28/1961 [id number]   History:    56 y.o.  for annual gyn exam with no complaints today. Patient stated that she has been receiving her blood work from her PCP Dr. Pharr. Patient's last bone density study was in 2016 with evidence of osteopenia.Review of her record also indicated that her mother had breast cancer and I have been recommending that the patient check with her mother to see if she was screened or could be screened for the BRCA1 and BRCA2 breast cancer gene mutation. Patient does her monthly self breast examination and her mammogram was normal in 2018.Her last colonoscopy was in 2010 by Dr. Buccini who had removed some benign polyps she had a colonoscopy in 2017 which was normal and she was told now that she's on a 10 year recall. Patient with no previous abnormal Pap smears. Patient on no hormone replacement therapy.  Past medical history,surgical history, family history and social history were all reviewed and documented in the EPIC chart.  Gynecologic History Patient's last menstrual period was 06/14/2008. Contraception: post menopausal status Last Pap: 2016. Results were: normal Last mammogram: 2018. Results were: normal  Obstetric History OB History  Gravida Para Term Preterm AB Living  4 2     2 2  SAB TAB Ectopic Multiple Live Births  2            # Outcome Date GA Lbr Len/2nd Weight Sex Delivery Anes PTL Lv  4 SAB           3 SAB           2 Para           1 Para                ROS: A ROS was performed and pertinent positives and negatives are included in the history.  GENERAL: No fevers or chills. HEENT: No change in vision, no earache, sore throat or sinus congestion. NECK: No pain or stiffness. CARDIOVASCULAR: No chest pain or pressure. No palpitations. PULMONARY: No shortness of breath, cough or wheeze. GASTROINTESTINAL: No abdominal pain, nausea, vomiting or diarrhea, melena or bright red blood per rectum. GENITOURINARY: No urinary frequency,  urgency, hesitancy or dysuria. MUSCULOSKELETAL: No joint or muscle pain, no back pain, no recent trauma. DERMATOLOGIC: No rash, no itching, no lesions. ENDOCRINE: No polyuria, polydipsia, no heat or cold intolerance. No recent change in weight. HEMATOLOGICAL: No anemia or easy bruising or bleeding. NEUROLOGIC: No headache, seizures, numbness, tingling or weakness. PSYCHIATRIC: No depression, no loss of interest in normal activity or change in sleep pattern.     Exam: chaperone present  BP 120/82   Ht 5' 6.25" (1.683 m)   Wt 183 lb 6.4 oz (83.2 kg)   LMP 06/14/2008   BMI 29.38 kg/m   Body mass index is 29.38 kg/m.  General appearance : Well developed well nourished female. No acute distress HEENT: Eyes: no retinal hemorrhage or exudates,  Neck supple, trachea midline, no carotid bruits, no thyroidmegaly Lungs: Clear to auscultation, no rhonchi or wheezes, or rib retractions  Heart: Regular rate and rhythm, no murmurs or gallops Breast:Examined in sitting and supine position were symmetrical in appearance, no palpable masses or tenderness,  no skin retraction, no nipple inversion, no nipple discharge, no skin discoloration, no axillary or supraclavicular lymphadenopathy Abdomen: no palpable masses or tenderness, no rebound or guarding Extremities: no edema or skin discoloration or tenderness  Pelvic:  Bartholin, Urethra, Skene Glands: Within   normal limits             Vagina: No gross lesions or discharge, atrophic changes  Cervix: No gross lesions or discharge  Uterus  anteverted, normal size, shape and consistency, non-tender and mobile  Adnexa  Without masses or tenderness  Anus and perineum  normal   Rectovaginal  normal sphincter tone without palpated masses or tenderness             Hemoccult cards provided     Assessment/Plan:  56 y.o. female for annual exam with past history of osteopenia we'll need to schedule bone density study for this June. We discussed importance of  calcium vitamin D weightbearing exercises for osteoporosis prevention. Patient was provided with a prescription to go to her local pharmacy to receive her shingles vaccine and literature information was provided also. Pap smear not indicated this year.   Terrance Mass MD, 9:20 AM 58/14/2018

## 2017-03-21 NOTE — Patient Instructions (Addendum)
Shingles Vaccine: What You Need to Know 1. What is shingles? Shingles is a painful skin rash, often with blisters. It is also called Herpes Zoster, or just Zoster. A shingles rash usually appears on one side of the face or body and lasts from 2 to 4 weeks. Its main symptom is pain, which can be quite severe. Other symptoms of shingles can include fever, headache, chills and upset stomach. Very rarely, a shingles infection can lead to pneumonia, hearing problems, blindness, brain inflammation (encephalitis) or death. For about 1 person in 5, severe pain can continue even long after the rash clears up. This is called post-herpetic neuralgia. Shingles is caused by the Varicella Zoster virus, the same virus that causes chickenpox. Only someone who has had chickenpox-or, rarely, has gotten chickenpox vaccine-can get shingles. The virus stays in your body, and can cause shingles many years later. You can't catch shingles from another person with shingles. However, a person who has never had chickenpox (or chicken pox vaccine) could get chickenpox from someone with shingles. This is not very common. Shingles is far more common in people 50 years of age and older than in younger people. It is also more common in people whose immune systems are weakened because of a disease such as cancer, or drugs such as steroids or chemotherapy. At least 1 million people a year in the [place] get shingles. 2. Shingles vaccine A vaccine for shingles was licensed in 2006. In clinical trials, the vaccine reduced the risk of shingles by 50%. It can also reduce pain in people who still get shingles after being vaccinated. A single dose of shingles vaccine is recommended for adults 60 years of age and older. 3. Some people should not get shingles vaccine or should wait A person should not get shingles vaccine who:  has ever had a life-threatening allergic reaction to gelatin, the antibiotic neomycin, or any other component  of shingles vaccine. Tell your doctor if you have any severe allergies.  has a weakened immune system because of current:  AIDS or another disease that affects the immune system,  treatment with drugs that affect the immune system, such as prolonged use of high-dose steroids,  cancer treatment such as radiation or chemotherapy,  cancer affecting the bone marrow or lymphatic system, such as leukemia or lymphoma.  is pregnant, or might be pregnant. Women should not become pregnant until at least 4 weeks after getting shingles vaccine. Someone with a minor acute illness, such as a cold, may be vaccinated. But anyone with a moderate or severe acute illness should usually wait until they recover before get ting the vaccine. This includes anyone with a temperature of 101.3 F or higher. 4. What are the risks from shingles vaccine? A vaccine, like any medicine, could possibly cause serious problems, such as severe allergic reactions. However, the risk of a vaccine causing serious harm, or death, is extremely small. No serious problems have been identified with shingles vaccine. Mild problems   Redness, soreness, swelling, or itching at the site of the injection (about 1 person in 3).  Headache (about 1 person in 70). Like all vaccines, shingles vaccine is being closely monitored for unusual or severe problems. 5. What if there is a serious reaction? What should I look for?   Look for anything that concerns you, such as signs of a severe allergic reaction, very high fever, or behavior changes. Signs of a severe allergic reaction can include hives, swelling of the face and throat,   difficulty breathing, a fast heartbeat, dizziness, and weakness. These would start a few minutes to a few hours after the vaccination. What should I do?   If you think it is a severe allergic reaction or other emergency that can't wait, call 9-1-1 or get the person to the nearest hospital. Otherwise, call your  doctor.  Afterward, the reaction should be reported to the Vaccine Adverse Event Reporting System (VAERS). Your doctor might file this report, or you can do it yourself through the VAERS web site at www.vaers.hhs.gov or by calling 1-800-822-7967. VAERS is only for reporting reactions. They do not give medical advice.  6. How can I learn more?  Ask your doctor.  Call your local or state health department.  Contact the Centers for Disease Control and Prevention (CDC):  Call 1-800-232-4636(1-800-CDC-INFO) or  Visit CDC's website at www.cdc.gov/vaccines CDC Vaccine Information Statement (VIS) Shingles Vaccine (08/13/2008) This information is not intended to replace advice given to you by your health care provider. Make sure you discuss any questions you have with your health care provider. Document Released: 08/22/2006 Document Revised: 09/18/2016 Document Reviewed: 09/18/2016 Elsevier Interactive Patient Education  2017 Elsevier Inc. Bone Densitometry Bone densitometry is an imaging test that uses a special X-ray to measure the amount of calcium and other minerals in your bones (bone density). This test is also known as a bone mineral density test or dual-energy X-ray absorptiometry (DXA). The test can measure bone density at your hip and your spine. It is similar to having a regular X-ray. You may have this test to:  Diagnose a condition that causes weak or thin bones (osteoporosis).  Predict your risk of a broken bone (fracture).  Determine how well osteoporosis treatment is working. Tell a health care provider about:  Any allergies you have.  All medicines you are taking, including vitamins, herbs, eye drops, creams, and over-the-counter medicines.  Any problems you or family members have had with anesthetic medicines.  Any blood disorders you have.  Any surgeries you have had.  Any medical conditions you have.  Possibility of pregnancy.  Any other medical test you had  within the previous 14 days that used contrast material. What are the risks? Generally, this is a safe procedure. However, problems can occur and may include the following:  This test exposes you to a very small amount of radiation.  The risks of radiation exposure may be greater to unborn children. What happens before the procedure?  Do not take any calcium supplements for 24 hours before having the test. You can otherwise eat and drink what you usually do.  Take off all metal jewelry, eyeglasses, dental appliances, and any other metal objects. What happens during the procedure?  You may lie on an exam table. There will be an X-ray generator below you and an imaging device above you.  Other devices, such as boxes or braces, may be used to position your body properly for the scan.  You will need to lie still while the machine slowly scans your body.  The images will show up on a computer monitor. What happens after the procedure? You may need more testing at a later time. This information is not intended to replace advice given to you by your health care provider. Make sure you discuss any questions you have with your health care provider. Document Released: 11/16/2004 Document Revised: 04/01/2016 Document Reviewed: 04/04/2014 Elsevier Interactive Patient Education  2017 Elsevier Inc.  

## 2017-03-23 ENCOUNTER — Encounter: Payer: Self-pay | Admitting: Gynecology

## 2017-12-05 ENCOUNTER — Encounter: Payer: Self-pay | Admitting: Obstetrics & Gynecology

## 2018-03-22 ENCOUNTER — Encounter: Payer: BC Managed Care – PPO | Admitting: Obstetrics & Gynecology

## 2018-06-30 ENCOUNTER — Encounter: Payer: Self-pay | Admitting: Obstetrics & Gynecology

## 2018-06-30 ENCOUNTER — Ambulatory Visit: Payer: BC Managed Care – PPO | Admitting: Obstetrics & Gynecology

## 2018-06-30 VITALS — BP 140/84 | Ht 65.75 in | Wt 202.0 lb

## 2018-06-30 DIAGNOSIS — Z6832 Body mass index (BMI) 32.0-32.9, adult: Secondary | ICD-10-CM

## 2018-06-30 DIAGNOSIS — Z780 Asymptomatic menopausal state: Secondary | ICD-10-CM

## 2018-06-30 DIAGNOSIS — M8588 Other specified disorders of bone density and structure, other site: Secondary | ICD-10-CM

## 2018-06-30 DIAGNOSIS — E6609 Other obesity due to excess calories: Secondary | ICD-10-CM | POA: Diagnosis not present

## 2018-06-30 DIAGNOSIS — Z01419 Encounter for gynecological examination (general) (routine) without abnormal findings: Secondary | ICD-10-CM | POA: Diagnosis not present

## 2018-06-30 NOTE — Addendum Note (Signed)
Addended by: Berna SpareASTILLO, Nastashia Gallo A on: 06/30/2018 11:41 AM   Modules accepted: Orders

## 2018-06-30 NOTE — Patient Instructions (Signed)
1. Encounter for routine gynecological examination with Papanicolaou smear of cervix Normal gynecologic exam.  Pap reflex done.  Breast exam normal.  Screening mammogram negative in January 2019.  Health labs with family physician.  Colonoscopy negative in 2018.  2. Menopause present Well on no hormone replacement therapy.  No postmenopausal bleeding.  3. Class 1 obesity due to excess calories without serious comorbidity with body mass index (BMI) of 32.0 to 32.9 in adult Patient has a healthy nutrition, recommend decreasing total calories and carbs.  Suggestion made for [employer] diet.  Recommend aerobic physical activity 5 times a week and weightlifting every 2 days.  Planning to start yoga.  4. Osteopenia of lumbar spine Will repeat bone density here now.  Recommend vitamin D supplements, calcium intake of 1.5 g/day including nutritional and supplemental.  Recommend regular weightbearing physical activity. - DG Bone Density; Future  Tresa EndoKelly, it was a pleasure meeting you today!  I will inform you of your results as soon as they are available.

## 2018-06-30 NOTE — Progress Notes (Signed)
Jeanell SparrowKelly J Ege [birth year] [id number]   History:    57 y.o. G4P2A2L2 Married.  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  2 sons doing well, 1 musician and 1 Designer, television/film setmathematician.  RP: Established patient presenting for annual gyn exam   HPI: Menopause, well without hormone replacement therapy.  No postmenopausal bleeding.  No pelvic pain.  Normal vaginal secretions.  No pain with intercourse, using lubricant.  Urine and bowel movements normal.  Breasts normal.  Body mass index 32.85.  Healthy nutrition.  Will increase physical activity.  Health labs with family physician.  Colonoscopy 2018 normal.  Past medical history,surgical history, family history and social history were all reviewed and documented in the EPIC chart.  Gynecologic History Patient's last menstrual period was 06/14/2008. Contraception: post menopausal status Last Pap: 03/2015. Results were: Negative Last mammogram: 11/2017. Results were: Negative Bone Density: 04/2015 Osteopenia -1.2 T-score at spine Colonoscopy: 2018 normal  Obstetric History OB History  Gravida Para Term Preterm AB Living  4 2     2 2   SAB TAB Ectopic Multiple Live Births  2            # Outcome Date GA Lbr Len/2nd Weight Sex Delivery Anes PTL Lv  4 SAB           3 SAB           2 Para           1 Para              ROS: A ROS was performed and pertinent positives and negatives are included in the history.  GENERAL: No fevers or chills. HEENT: No change in vision, no earache, sore throat or sinus congestion. NECK: No pain or stiffness. CARDIOVASCULAR: No chest pain or pressure. No palpitations. PULMONARY: No shortness of breath, cough or wheeze. GASTROINTESTINAL: No abdominal pain, nausea, vomiting or diarrhea, melena or bright red blood per rectum. GENITOURINARY: No urinary frequency, urgency, hesitancy or dysuria. MUSCULOSKELETAL: No joint or muscle pain, no back pain, no recent trauma. DERMATOLOGIC: No rash, no itching, no lesions.  ENDOCRINE: No polyuria, polydipsia, no heat or cold intolerance. No recent change in weight. HEMATOLOGICAL: No anemia or easy bruising or bleeding. NEUROLOGIC: No headache, seizures, numbness, tingling or weakness. PSYCHIATRIC: No depression, no loss of interest in normal activity or change in sleep pattern.     Exam:   BP 140/84   Ht 5' 5.75" (1.67 m)   Wt 202 lb (91.6 kg)   LMP 06/14/2008   BMI 32.85 kg/m   Body mass index is 32.85 kg/m.  General appearance : Well developed well nourished female. No acute distress HEENT: Eyes: no retinal hemorrhage or exudates,  Neck supple, trachea midline, no carotid bruits, no thyroidmegaly Lungs: Clear to auscultation, no rhonchi or wheezes, or rib retractions  Heart: Regular rate and rhythm, no murmurs or gallops Breast:Examined in sitting and supine position were symmetrical in appearance, no palpable masses or tenderness,  no skin retraction, no nipple inversion, no nipple discharge, no skin discoloration, no axillary or supraclavicular lymphadenopathy Abdomen: no palpable masses or tenderness, no rebound or guarding Extremities: no edema or skin discoloration or tenderness  Pelvic: Vulva: Normal             Vagina: No gross lesions or discharge  Cervix: No gross lesions or discharge.  Pap reflex done  Uterus  AV, normal size, shape and consistency, non-tender and mobile  Adnexa  Without masses or tenderness  Anus:  Normal   Assessment/Plan:  57 y.o. female for annual exam   1. Encounter for routine gynecological examination with Papanicolaou smear of cervix Normal gynecologic exam.  Pap reflex done.  Breast exam normal.  Screening mammogram negative in January 2019.  Health labs with family physician.  Colonoscopy negative in 2018.  2. Menopause present Well on no hormone replacement therapy.  No postmenopausal bleeding.  3. Class 1 obesity due to excess calories without serious comorbidity with body mass index (BMI) of 32.0 to 32.9  in adult Patient has a healthy nutrition, recommend decreasing total calories and carbs.  Suggestion made for [employer].  Recommend aerobic physical activity 5 times a week and weightlifting every 2 days.  Planning to start yoga.  4. Osteopenia of lumbar spine Will repeat bone density here now.  Recommend vitamin D supplements, calcium intake of 1.5 g/day including nutritional and supplemental.  Recommend regular weightbearing physical activity. - DG Bone Density; Future   Genia Del MD, 9:16 AM 06/30/2018

## 2018-07-03 LAB — PAP IG W/ RFLX HPV ASCU

## 2018-07-05 ENCOUNTER — Other Ambulatory Visit: Payer: Self-pay | Admitting: Gynecology

## 2018-07-05 DIAGNOSIS — M8588 Other specified disorders of bone density and structure, other site: Secondary | ICD-10-CM

## 2018-07-09 DIAGNOSIS — M8580 Other specified disorders of bone density and structure, unspecified site: Secondary | ICD-10-CM

## 2018-07-09 HISTORY — DX: Other specified disorders of bone density and structure, unspecified site: M85.80

## 2018-07-11 ENCOUNTER — Other Ambulatory Visit: Payer: Self-pay | Admitting: Gynecology

## 2018-07-11 ENCOUNTER — Other Ambulatory Visit: Payer: Self-pay | Admitting: Obstetrics & Gynecology

## 2018-07-11 ENCOUNTER — Encounter: Payer: Self-pay | Admitting: Gynecology

## 2018-07-11 ENCOUNTER — Ambulatory Visit (INDEPENDENT_AMBULATORY_CARE_PROVIDER_SITE_OTHER): Payer: BC Managed Care – PPO

## 2018-07-11 DIAGNOSIS — Z13820 Encounter for screening for osteoporosis: Secondary | ICD-10-CM | POA: Diagnosis not present

## 2018-07-11 DIAGNOSIS — M8588 Other specified disorders of bone density and structure, other site: Secondary | ICD-10-CM

## 2018-07-11 DIAGNOSIS — M8589 Other specified disorders of bone density and structure, multiple sites: Secondary | ICD-10-CM | POA: Diagnosis not present

## 2018-10-09 ENCOUNTER — Other Ambulatory Visit: Payer: Self-pay | Admitting: Gastroenterology

## 2018-10-09 DIAGNOSIS — R1013 Epigastric pain: Secondary | ICD-10-CM

## 2018-10-16 ENCOUNTER — Ambulatory Visit
Admission: RE | Admit: 2018-10-16 | Discharge: 2018-10-16 | Disposition: A | Discharge status: Home / Self Care | Payer: BC Managed Care – PPO | Source: Ambulatory Visit | Attending: Gastroenterology | Admitting: Gastroenterology

## 2018-10-16 DIAGNOSIS — R1013 Epigastric pain: Secondary | ICD-10-CM

## 2018-12-09 HISTORY — PX: CHOLECYSTECTOMY: SHX55

## 2018-12-11 ENCOUNTER — Encounter: Payer: Self-pay | Admitting: Obstetrics & Gynecology

## 2018-12-15 ENCOUNTER — Other Ambulatory Visit: Payer: Self-pay | Admitting: General Surgery

## 2019-07-06 ENCOUNTER — Other Ambulatory Visit: Payer: Self-pay

## 2019-07-09 ENCOUNTER — Ambulatory Visit: Payer: BC Managed Care – PPO | Admitting: Obstetrics & Gynecology

## 2019-07-09 ENCOUNTER — Other Ambulatory Visit: Payer: Self-pay

## 2019-07-09 ENCOUNTER — Encounter: Payer: Self-pay | Admitting: Obstetrics & Gynecology

## 2019-07-09 VITALS — BP 140/90 | Ht 65.5 in | Wt 193.0 lb

## 2019-07-09 DIAGNOSIS — E6609 Other obesity due to excess calories: Secondary | ICD-10-CM | POA: Diagnosis not present

## 2019-07-09 DIAGNOSIS — Z01419 Encounter for gynecological examination (general) (routine) without abnormal findings: Secondary | ICD-10-CM

## 2019-07-09 DIAGNOSIS — Z780 Asymptomatic menopausal state: Secondary | ICD-10-CM | POA: Diagnosis not present

## 2019-07-09 DIAGNOSIS — M8588 Other specified disorders of bone density and structure, other site: Secondary | ICD-10-CM | POA: Diagnosis not present

## 2019-07-09 DIAGNOSIS — Z6831 Body mass index (BMI) 31.0-31.9, adult: Secondary | ICD-10-CM

## 2019-07-09 NOTE — Patient Instructions (Signed)
1. Well female exam with routine gynecological exam Normal gynecologic exam in menopause.  Pap test August 2019 was negative, no indication to repeat this year.  Breast exam normal, screening mammogram February 2020 was negative.  Colonoscopy in 2018.  Health labs with family physician.  2. Postmenopause Well on no hormone replacement therapy.  No postmenopausal bleeding.  3. Osteopenia of lumbar spine Last bone density September 2019 showed osteopenia at multiple sites, lowest T score at the lumbar spine at -2.0.  Recommend vitamin D supplements, calcium intake of 1200 mg daily and regular weightbearing physical activities.  Will repeat bone density in September 2021.  4. Class 1 obesity due to excess calories without serious comorbidity with body mass index (BMI) of 31.0 to 31.9 in adult Recommend a lower calorie/carb diet such as [employer].  Aerobic physical activities 5 times a week and weightlifting every 2 days.  Oprah, it was a pleasure seeing you today!

## 2019-07-09 NOTE — Progress Notes (Signed)
Marie Robinson J Balinski Nov 05, 1961 [id number]   History:    58 y.o.  G4P2A2L2 Married.  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  2 sons doing well, 1 musician and 1 Designer, television/film setmathematician.  RP: Established patient presenting for annual gyn exam   HPI: Menopause, well without hormone replacement therapy.  No postmenopausal bleeding.  No pelvic pain.  Normal vaginal secretions.  No pain with intercourse, using lubricant.  Urine and bowel movements normal.  Breasts normal.  Body mass index 31.63.  Healthy nutrition.  Will increase physical activity.  Health labs with family physician.  Colonoscopy 2018 normal.   Past medical history,surgical history, family history and social history were all reviewed and documented in the EPIC chart.  Gynecologic History Patient's last menstrual period was 06/14/2008. Contraception: post menopausal status Last Pap: 06/2018. Results were: Negative Last mammogram: 12/2018. Results were: Negative Bone Density: 07/2018 Osteopenia Colonoscopy: 2018  Obstetric History OB History  Gravida Para Term Preterm AB Living  4 2     2 2   SAB TAB Ectopic Multiple Live Births  2            # Outcome Date GA Lbr Len/2nd Weight Sex Delivery Anes PTL Lv  4 SAB           3 SAB           2 Para           1 Para              ROS: A ROS was performed and pertinent positives and negatives are included in the history.  GENERAL: No fevers or chills. HEENT: No change in vision, no earache, sore throat or sinus congestion. NECK: No pain or stiffness. CARDIOVASCULAR: No chest pain or pressure. No palpitations. PULMONARY: No shortness of breath, cough or wheeze. GASTROINTESTINAL: No abdominal pain, nausea, vomiting or diarrhea, melena or bright red blood per rectum. GENITOURINARY: No urinary frequency, urgency, hesitancy or dysuria. MUSCULOSKELETAL: No joint or muscle pain, no back pain, no recent trauma. DERMATOLOGIC: No rash, no itching, no lesions. ENDOCRINE: No polyuria,  polydipsia, no heat or cold intolerance. No recent change in weight. HEMATOLOGICAL: No anemia or easy bruising or bleeding. NEUROLOGIC: No headache, seizures, numbness, tingling or weakness. PSYCHIATRIC: No depression, no loss of interest in normal activity or change in sleep pattern.     Exam:   BP 140/90   Ht 5' 5.5" (1.664 m)   Wt 193 lb (87.5 kg)   LMP 06/14/2008   BMI 31.63 kg/m   Body mass index is 31.63 kg/m.  General appearance : Well developed well nourished female. No acute distress HEENT: Eyes: no retinal hemorrhage or exudates,  Neck supple, trachea midline, no carotid bruits, no thyroidmegaly Lungs: Clear to auscultation, no rhonchi or wheezes, or rib retractions  Heart: Regular rate and rhythm, no murmurs or gallops Breast:Examined in sitting and supine position were symmetrical in appearance, no palpable masses or tenderness,  no skin retraction, no nipple inversion, no nipple discharge, no skin discoloration, no axillary or supraclavicular lymphadenopathy Abdomen: no palpable masses or tenderness, no rebound or guarding Extremities: no edema or skin discoloration or tenderness  Pelvic: Vulva: Normal             Vagina: No gross lesions or discharge  Cervix: No gross lesions or discharge  Uterus  AV, normal size, shape and consistency, non-tender and mobile  Adnexa  Without masses or tenderness  Anus: Normal   Assessment/Plan:  58 y.o.  female for annual exam   1. Well female exam with routine gynecological exam Normal gynecologic exam in menopause.  Pap test August 2019 was negative, no indication to repeat this year.  Breast exam normal, screening mammogram February 2020 was negative.  Colonoscopy in 2018.  Health labs with family physician.  2. Postmenopause Well on no hormone replacement therapy.  No postmenopausal bleeding.  3. Osteopenia of lumbar spine Last bone density September 2019 showed osteopenia at multiple sites, lowest T score at the lumbar spine  at -2.0.  Recommend vitamin D supplements, calcium intake of 1200 mg daily and regular weightbearing physical activities.  Will repeat bone density in September 2021.  4. Class 1 obesity due to excess calories without serious comorbidity with body mass index (BMI) of 31.0 to 31.9 in adult Recommend a lower calorie/carb diet such as [employer].  Aerobic physical activities 5 times a week and weightlifting every 2 days.  Marie Robinson, 8:54 AM 07/09/2019

## 2019-08-07 ENCOUNTER — Encounter: Payer: Self-pay | Admitting: Gynecology

## 2019-12-17 ENCOUNTER — Encounter: Payer: Self-pay | Admitting: Obstetrics & Gynecology

## 2020-07-10 ENCOUNTER — Encounter: Payer: BC Managed Care – PPO | Admitting: Obstetrics & Gynecology

## 2020-12-04 ENCOUNTER — Other Ambulatory Visit: Payer: Self-pay

## 2020-12-04 ENCOUNTER — Encounter: Payer: Self-pay | Admitting: Obstetrics & Gynecology

## 2020-12-04 ENCOUNTER — Ambulatory Visit (INDEPENDENT_AMBULATORY_CARE_PROVIDER_SITE_OTHER): Payer: BC Managed Care – PPO | Admitting: Obstetrics & Gynecology

## 2020-12-04 VITALS — BP 132/92 | HR 68 | Resp 20 | Ht 65.75 in | Wt 197.6 lb

## 2020-12-04 DIAGNOSIS — Z780 Asymptomatic menopausal state: Secondary | ICD-10-CM | POA: Diagnosis not present

## 2020-12-04 DIAGNOSIS — I10 Essential (primary) hypertension: Secondary | ICD-10-CM

## 2020-12-04 DIAGNOSIS — Z6832 Body mass index (BMI) 32.0-32.9, adult: Secondary | ICD-10-CM

## 2020-12-04 DIAGNOSIS — M8588 Other specified disorders of bone density and structure, other site: Secondary | ICD-10-CM | POA: Diagnosis not present

## 2020-12-04 DIAGNOSIS — Z01419 Encounter for gynecological examination (general) (routine) without abnormal findings: Secondary | ICD-10-CM | POA: Diagnosis not present

## 2020-12-04 DIAGNOSIS — E6609 Other obesity due to excess calories: Secondary | ICD-10-CM

## 2020-12-04 NOTE — Progress Notes (Signed)
Marie Robinson 1960/11/12 [id number]   History:    60 y.o. G4P2A2L2 Married. Professional clarinetist, Herbalist. 2 sons doing well, 1 musician and 1 Designer, television/film set.  [id number] presenting for annual gyn exam   UXL:[id number], well without hormone replacement therapy. No postmenopausal bleeding. No pelvic pain. Normal vaginal secretions. No pain with intercourse, using lubricant. Urine and bowel movements normal. Breasts normal. Body mass index 32.14. Healthy nutrition. Will increase physical activity. Health labs with family physician. Colonoscopy 2018 normal.   Obstetric History OB History  Gravida Para Term Preterm AB Living  4 2     2 2   SAB IAB Ectopic Multiple Live Births  2            # Outcome Date GA Lbr Len/2nd Weight Sex Delivery Anes PTL Lv  4 SAB           3 SAB           2 Para           1 Para              ROS: A ROS was performed and pertinent positives and negatives are included in the history.  GENERAL: No fevers or chills. HEENT: No change in vision, no earache, sore throat or sinus congestion. NECK: No pain or stiffness. CARDIOVASCULAR: No chest pain or pressure. No palpitations. PULMONARY: No shortness of breath, cough or wheeze. GASTROINTESTINAL: No abdominal pain, nausea, vomiting or diarrhea, melena or bright red blood per rectum. GENITOURINARY: No urinary frequency, urgency, hesitancy or dysuria. MUSCULOSKELETAL: No joint or muscle pain, no back pain, no recent trauma. DERMATOLOGIC: No rash, no itching, no lesions. ENDOCRINE: No polyuria, polydipsia, no heat or cold intolerance. No recent change in weight. HEMATOLOGICAL: No anemia or easy bruising or bleeding. NEUROLOGIC: No headache, seizures, numbness, tingling or weakness. PSYCHIATRIC: No depression, no loss of interest in normal activity or change in sleep pattern.     Exam:   BP (!) 150/90 (BP Location: Left Arm, Patient Position: Sitting)   Pulse  68   Resp 20   Ht 5' 5.75" (1.67 m)   Wt 197 lb 9.6 oz (89.6 kg)   LMP 06/14/2008   BMI 32.14 kg/m   Body mass index is 32.14 kg/m.  General appearance : Well developed well nourished female. No acute distress HEENT: Eyes: no retinal hemorrhage or exudates,  Neck supple, trachea midline, no carotid bruits, no thyroidmegaly Lungs: Clear to auscultation, no rhonchi or wheezes, or rib retractions  Heart: Regular rate and rhythm, no murmurs or gallops Breast:Examined in sitting and supine position were symmetrical in appearance, no palpable masses or tenderness,  no skin retraction, no nipple inversion, no nipple discharge, no skin discoloration, no axillary or supraclavicular lymphadenopathy Abdomen: no palpable masses or tenderness, no rebound or guarding Extremities: no edema or skin discoloration or tenderness  Pelvic: Vulva: Normal             Vagina: No gross lesions or discharge  Cervix: No gross lesions or discharge  Uterus  AV, normal size, shape and consistency, non-tender and mobile  Adnexa  Without masses or tenderness  Anus: Normal   Assessment/Plan:  60 y.o. female for annual exam   1. Encounter for routine gynecological examination with Papanicolaou smear of cervix Normal gynecologic exam in menopause.  No indication for Pap test this year, Pap test August 2019 was negative.  Breast exam normal.  Screening mammogram February 2021 was negative.  Colonoscopy  2018.  Health labs with family physician.  2. Postmenopause Well on no hormone replacement therapy.  No postmenopausal bleeding.  3. Osteopenia of lumbar spine Bone density September 2019 showed osteopenia.  We will repeat a bone density now.  Vitamin D supplements, calcium intake of 1500 mg daily and regular weightbearing physical activity is recommended. - DG Bone Density; Future  4. Hypertension, unspecified type High blood pressure at today's visit.  Recommend prompt evaluation with family physician.  Low-salt  diet.  5. Class 1 obesity due to excess calories with serious comorbidity and body mass index (BMI) of 32.0 to 32.9 in adult Low calorie/carb diet.  Aerobic activities 5 times a week and light weightlifting every 2 days.  Genia Del MD, 2:11 PM 12/04/2020

## 2020-12-05 LAB — PAP IG W/ RFLX HPV ASCU

## 2020-12-06 ENCOUNTER — Encounter: Payer: Self-pay | Admitting: Obstetrics & Gynecology

## 2020-12-22 ENCOUNTER — Encounter: Payer: Self-pay | Admitting: Obstetrics & Gynecology

## 2021-04-09 ENCOUNTER — Other Ambulatory Visit: Payer: Self-pay | Admitting: Internal Medicine

## 2021-04-09 DIAGNOSIS — E7800 Pure hypercholesterolemia, unspecified: Secondary | ICD-10-CM

## 2021-05-22 ENCOUNTER — Ambulatory Visit
Admission: RE | Admit: 2021-05-22 | Discharge: 2021-05-22 | Disposition: A | Discharge status: Home / Self Care | Payer: BC Managed Care – PPO | Source: Ambulatory Visit | Attending: Internal Medicine | Admitting: Internal Medicine

## 2021-05-22 ENCOUNTER — Other Ambulatory Visit: Payer: Self-pay

## 2021-05-22 DIAGNOSIS — E7800 Pure hypercholesterolemia, unspecified: Secondary | ICD-10-CM

## 2021-12-30 ENCOUNTER — Encounter: Payer: Self-pay | Admitting: Obstetrics & Gynecology

## 2022-03-17 ENCOUNTER — Ambulatory Visit (INDEPENDENT_AMBULATORY_CARE_PROVIDER_SITE_OTHER): Payer: BC Managed Care – PPO | Admitting: Obstetrics & Gynecology

## 2022-03-17 ENCOUNTER — Encounter: Payer: Self-pay | Admitting: Obstetrics & Gynecology

## 2022-03-17 VITALS — BP 120/80 | HR 64 | Resp 16 | Ht 65.25 in | Wt 201.0 lb

## 2022-03-17 DIAGNOSIS — Z01419 Encounter for gynecological examination (general) (routine) without abnormal findings: Secondary | ICD-10-CM | POA: Diagnosis not present

## 2022-03-17 DIAGNOSIS — Z6833 Body mass index (BMI) 33.0-33.9, adult: Secondary | ICD-10-CM

## 2022-03-17 DIAGNOSIS — E6609 Other obesity due to excess calories: Secondary | ICD-10-CM | POA: Diagnosis not present

## 2022-03-17 DIAGNOSIS — Z780 Asymptomatic menopausal state: Secondary | ICD-10-CM | POA: Diagnosis not present

## 2022-03-17 DIAGNOSIS — M8588 Other specified disorders of bone density and structure, other site: Secondary | ICD-10-CM | POA: Diagnosis not present

## 2022-03-17 DIAGNOSIS — E66811 Obesity, class 1: Secondary | ICD-10-CM

## 2022-03-17 NOTE — Progress Notes (Signed)
? ? ?Marie Robinson October 27, 1961 [id number] ? ? ?History:    61 y.o.  G42P2A2L2 Married.  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  2 sons doing well, 1 musician and 1 Designer, fashion/clothing. ?  ?RP: Established patient presenting for annual gyn exam  ?  ?HPI: Postmenopause, well without hormone replacement therapy.  No postmenopausal bleeding.  No pelvic pain.  Normal vaginal secretions.  No pain with intercourse, using lubricant.  Pap 11/2020 Neg. No h/o abnormal Pap.  Will repeat Pap at 1 yrs. Urine and bowel movements normal.  Breasts normal. Mammo 12/2021 Neg. Body mass index 33.19.  Healthy nutrition.  Will increase physical activity.  BD 07/2018 Osteopenia T-Score -2.0 at [facility]. Will repeat this year in [place]. Health labs with family physician.  Colonoscopy 2018 normal. ?  ? ?Past medical history,surgical history, family history and social history were all reviewed and documented in the EPIC chart. ? ?Gynecologic History ?Patient's last menstrual period was 06/14/2008. ? ?Obstetric History ?OB History  ?Gravida Para Term Preterm AB Living  ?4 2     2 2   ?SAB IAB Ectopic Multiple Live Births  ?2       2  ?  ?# Outcome Date GA Lbr Len/2nd Weight Sex Delivery Anes PTL Lv  ?4 SAB           ?3 SAB           ?2 Para           ?1 Para           ? ? ? ?ROS: A ROS was performed and pertinent positives and negatives are included in the history. ? GENERAL: No fevers or chills. HEENT: No change in vision, no earache, sore throat or sinus congestion. NECK: No pain or stiffness. CARDIOVASCULAR: No chest pain or pressure. No palpitations. PULMONARY: No shortness of breath, cough or wheeze. GASTROINTESTINAL: No abdominal pain, nausea, vomiting or diarrhea, melena or bright red blood per rectum. GENITOURINARY: No urinary frequency, urgency, hesitancy or dysuria. MUSCULOSKELETAL: No joint or muscle pain, no back pain, no recent trauma. DERMATOLOGIC: No rash, no itching, no lesions. ENDOCRINE: No  polyuria, polydipsia, no heat or cold intolerance. No recent change in weight. HEMATOLOGICAL: No anemia or easy bruising or bleeding. NEUROLOGIC: No headache, seizures, numbness, tingling or weakness. PSYCHIATRIC: No depression, no loss of interest in normal activity or change in sleep pattern.  ?  ? ?Exam: ? ? ?BP 120/80   Pulse 64   Resp 16   Ht 5' 5.25" (1.657 m)   Wt 201 lb (91.2 kg)   LMP 06/14/2008   BMI 33.19 kg/m?  ? ?Body mass index is 33.19 kg/m?. ? ?General appearance : Well developed well nourished female. No acute distress ?HEENT: Eyes: no retinal hemorrhage or exudates,  Neck supple, trachea midline, no carotid bruits, no thyroidmegaly ?Lungs: Clear to auscultation, no rhonchi or wheezes, or rib retractions  ?Heart: Regular rate and rhythm, no murmurs or gallops ?Breast:Examined in sitting and supine position were symmetrical in appearance, no palpable masses or tenderness,  no skin retraction, no nipple inversion, no nipple discharge, no skin discoloration, no axillary or supraclavicular lymphadenopathy ?Abdomen: no palpable masses or tenderness, no rebound or guarding ?Extremities: no edema or skin discoloration or tenderness ? ?Pelvic: Vulva: Normal ?            Vagina: No gross lesions or discharge ? Cervix: No gross lesions or discharge ? Uterus AV,  normal size, shape and consistency, non-tender and mobile ? Adnexa  Without masses or tenderness ? Anus: Normal ? ? ?Assessment/Plan:  61 y.o. female for annual exam  ? ?1. Well female exam with routine gynecological exam ?Postmenopause, well without hormone replacement therapy.  No postmenopausal bleeding.  No pelvic pain.  Normal vaginal secretions.  No pain with intercourse, using lubricant.  Pap 11/2020 Neg. No h/o abnormal Pap.  Will repeat Pap at 1 yrs. Urine and bowel movements normal.  Breasts normal. Mammo 12/2021 Neg. Body mass index 33.19.  Healthy nutrition.  Will increase physical activity.  BD 07/2018 [facility] Will repeat this year in [place]. Health labs with family physician.  Colonoscopy 2018 normal. ?  ?2. Postmenopause ?Postmenopause, well without hormone replacement therapy.  No postmenopausal bleeding.  No pelvic pain.  Normal vaginal secretions.  No pain with intercourse, using lubricant.  ? ?3. Osteopenia of lumbar spine ?Will increase physical activity.  BD 07/2018 Osteopenia T-Score -2.0 at [facility]. Will repeat this year in [place]. Vit D, Ca++ total 1.5 g/d, wt bearing physical activities. ? ?4. Class 1 obesity due to excess calories with serious comorbidity and body mass index (BMI) of 33.0 to 33.9 in adult ?Lower calorie/carb diet.  Increase weight bearing physical activities. ? ?Other orders ?- tretinoin (RETIN-A) 0.025 % cream; Apply topically daily. (Patient not taking: Reported on 03/17/2022)  ? ?Princess Bruins MD, 4:16 PM 03/17/2022 ? ?  ?

## 2022-05-31 ENCOUNTER — Ambulatory Visit (INDEPENDENT_AMBULATORY_CARE_PROVIDER_SITE_OTHER): Payer: Self-pay | Admitting: Audiologist

## 2022-06-10 ENCOUNTER — Ambulatory Visit (INDEPENDENT_AMBULATORY_CARE_PROVIDER_SITE_OTHER): Payer: 59

## 2022-06-10 ENCOUNTER — Other Ambulatory Visit: Payer: Self-pay

## 2022-06-10 DIAGNOSIS — H903 Sensorineural hearing loss, bilateral: Secondary | ICD-10-CM

## 2022-06-10 NOTE — Progress Notes (Unsigned)
AUDIOLOGY, APPLIED HUMAN SCIENCES BUILDING  [address]  [place] [address]  [facility]  Progress Note    Name: Deanna Parks MRN:  [id number]  Date: 06/10/2022 Age: 61 y.o.    Chief Complaint: No chief complaint on file.   Deanna Parks hearing was screened in this clinic on 05/14/2022. She had a score of 24 on the Revised Hearing Handicap Inventory-Screening, which indicated significant hearing handicap. Also, Deanna Parks did not hear 3 of the 8 screening tones.     Subjective:   Otologic History  Difficulty hearing: Reported BOTH  Deanna Parks DENIED ear pain, fullness, tympanic membrane perforations, childhood and recent ear infections, PE tubes, or ear surgeries.  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   Characterized as ringing and clicking  Onset: unsure  Dizziness  Deanna Parks Denied  a history of dizziness.     Medical History     Vision Difficulty: Denied   Dexterity Difficulty:  Denied   Family history of genetic hearing loss: Reported age related and noise exposure losses.    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    Speech Recognition Threshold (SRT):  RIGHT: 25  LEFT: 20  SRT in Good agreement with PTA in both ears.    Word  Recognition Score (WRS):   RIGHT: 88% at 65 dB HL   LEFT: 100% at 60 dB HL    Inter-test reliability: Good    Speech In Noise (SIN):    SNR loss: 1.5    Assessment:    Revised Hearing Handicap Inventory Screening (RHHI-S) Score: Deanna Parks scored a 24 on the RHHI-S. A score greater than 6 suggests self-perceived handicap due to difficulty hearing.     Tympanograms indicate middle ear functioning normally.    Acoustic reflex thresholds consistent with hearing thresholds.     QuickSIN SNR loss reveals a normal Mild SNR loss indicating expected improvement with a directional microphone to be may hear almost as well as normals hearing in noise    Counseled regarding the results of the audiogram today. Deanna Parks was counseled on hearing aids and the benefit they may provide. Deanna Parks was also counseled on communication strategies to assist in difficult listening environments.      Plan:   ***    UPLOAD INTO MEDIA!!!      Care was assisted/ observed today (graduate students/ colleague/ self) as supervised by (self if applicable).    Deanna Parks, CCC-A

## 2022-06-16 ENCOUNTER — Encounter (HOSPITAL_BASED_OUTPATIENT_CLINIC_OR_DEPARTMENT_OTHER): Payer: Self-pay | Admitting: Family

## 2022-06-16 NOTE — Telephone Encounter (Signed)
Please get info on doses and meds that need to be refilled before her Sept appt.   I can send these in for 1 month.

## 2022-06-16 NOTE — Telephone Encounter (Signed)
From: Eyla Tallon Parks  To: Beth Tressie Ellis, APRN,NP-C  Sent: 06/16/2022 9:50 AM EDT  Subject: Need meds refilled    Dr. Reola Calkins, I am a new patient with my first appointment a week from today. I only have 3 days left of my lexicpro and wellbutrin meds. Can you write a script for 5 days for each to get me through until our meeting?    I can't get back to [place], [place] for a refill and I waited too long to take care of this. Thanks for considering.    Deanna Parks, Director of the [employer],     see you soon

## 2022-06-21 ENCOUNTER — Encounter (HOSPITAL_BASED_OUTPATIENT_CLINIC_OR_DEPARTMENT_OTHER): Payer: Self-pay | Admitting: Family

## 2022-06-21 MED ORDER — BUPROPION HCL XL 300 MG 24 HR TABLET, EXTENDED RELEASE
300.0000 mg | ORAL_TABLET | Freq: Every day | ORAL | 1 refills | Status: DC
Start: 2022-06-21 — End: 2022-07-21

## 2022-06-21 MED ORDER — ESCITALOPRAM 20 MG TABLET
20.0000 mg | ORAL_TABLET | Freq: Every day | ORAL | 1 refills | Status: DC
Start: 2022-06-21 — End: 2022-07-21

## 2022-06-21 NOTE — Telephone Encounter (Signed)
Please see what pharmacy to send this to.

## 2022-06-23 ENCOUNTER — Other Ambulatory Visit: Payer: Self-pay

## 2022-06-24 ENCOUNTER — Ambulatory Visit (HOSPITAL_BASED_OUTPATIENT_CLINIC_OR_DEPARTMENT_OTHER): Payer: 59 | Admitting: Family

## 2022-07-06 IMAGING — CT CT CARDIAC CORONARY ARTERY CALCIUM SCORE
3 series · 14 of 20 positions shown, 16 images · non-contrast
Comparison: None.

CLINICAL DATA: Hyperlipidemia

EXAM:
CT CARDIAC CORONARY ARTERY CALCIUM SCORE
TECHNIQUE: Non-contrast imaging through the heart was performed using
prospective ECG gating. Image post processing was performed on an
independent workstation, allowing for quantitative analysis of the
heart and coronary arteries. Note that this exam targets the heart
and the chest was not imaged in its entirety.

[Series 2: calcium scoring 2.00 qr36 bestdiast 69% hrt calciu · axial · 0.42mm/px · z∈[+1594,+1678]mm · 4 of 70 slices shown]
[im 14/70  vessel]
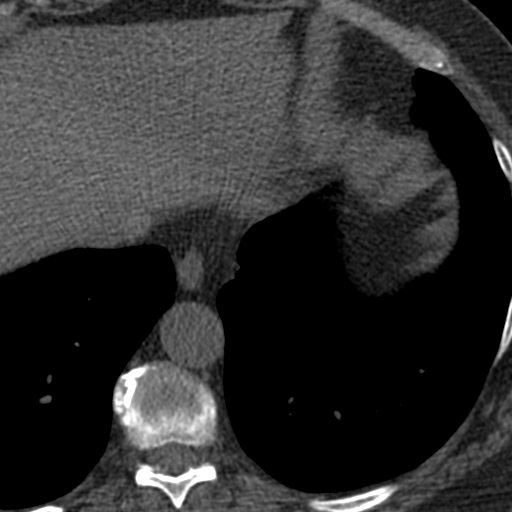
[im 28/70  vessel]
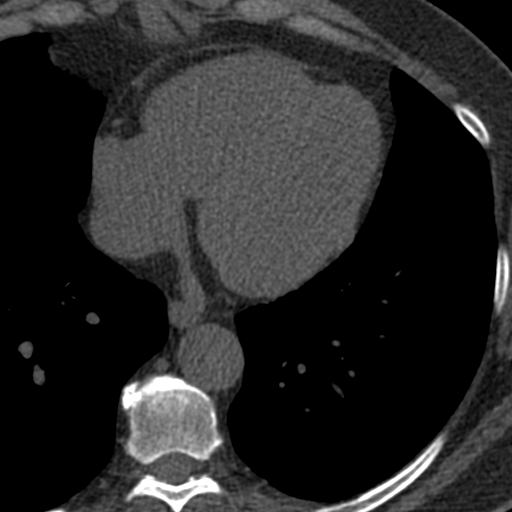
[im 42/70  vessel]
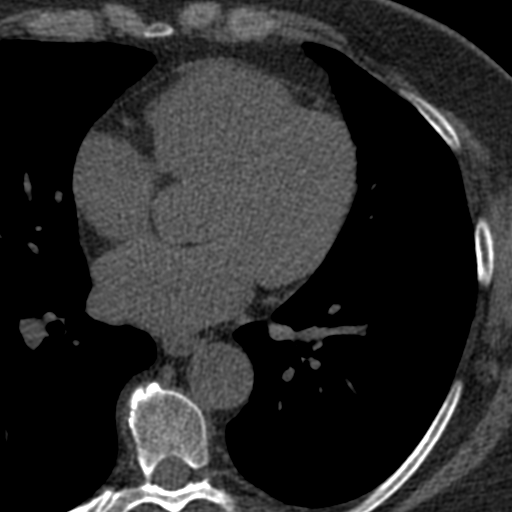
[im 56/70  vessel]
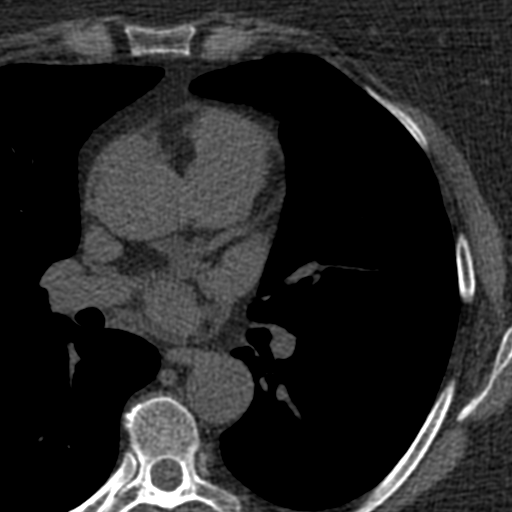

[Series 3: calcium scoring 2.00 br40 bestdiast 69% axial · axial · 0.61mm/px · z∈[+1590,+1682]mm · 5 of 70 slices shown, 7 images]
[im 12/70  vessel]
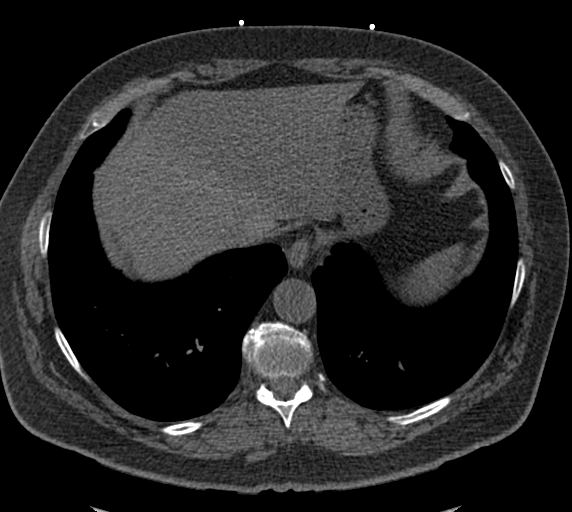
[im 12/70  lung]
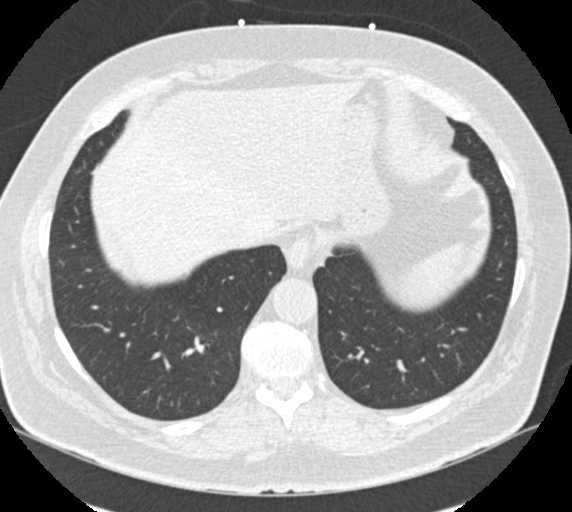
[im 24/70  vessel]
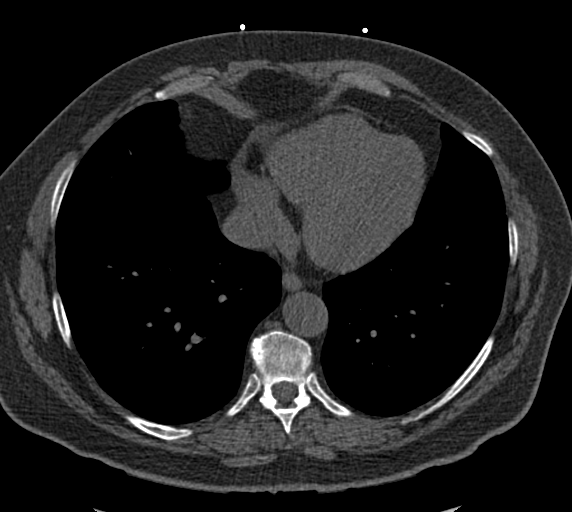
[im 35/70  vessel]
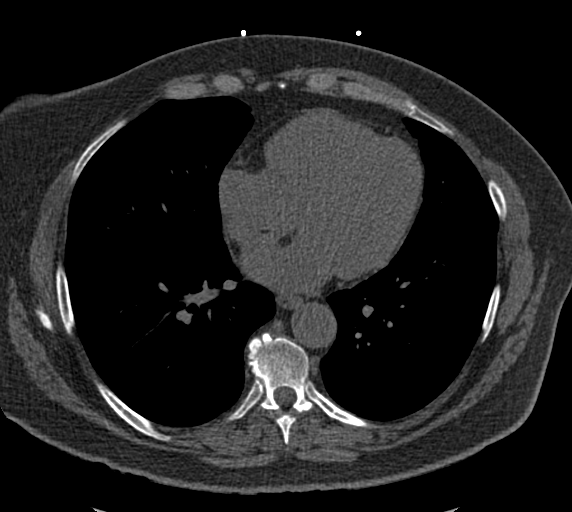
[im 47/70  vessel]
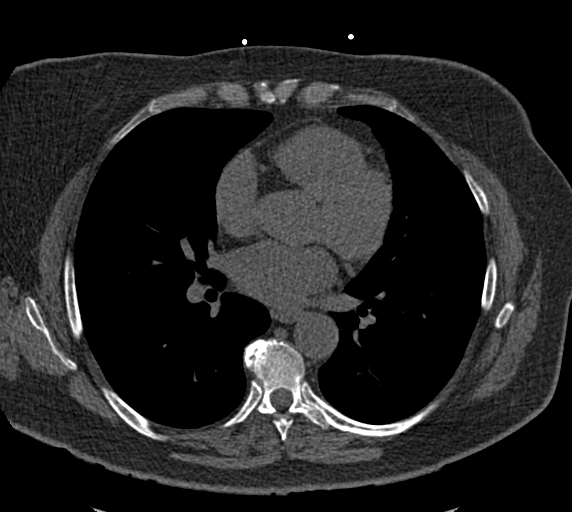
[im 58/70  vessel]
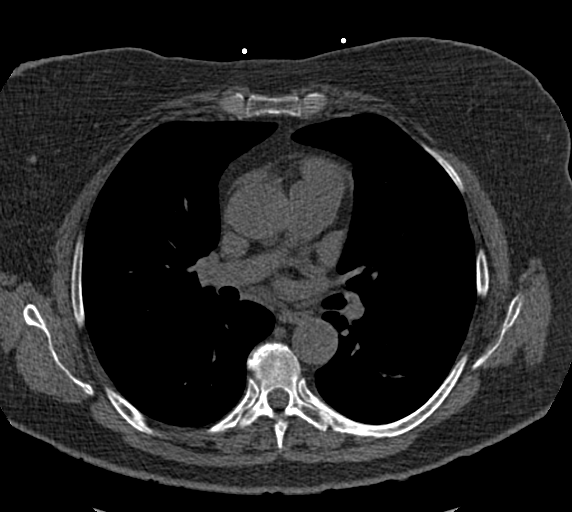
[im 58/70  lung]
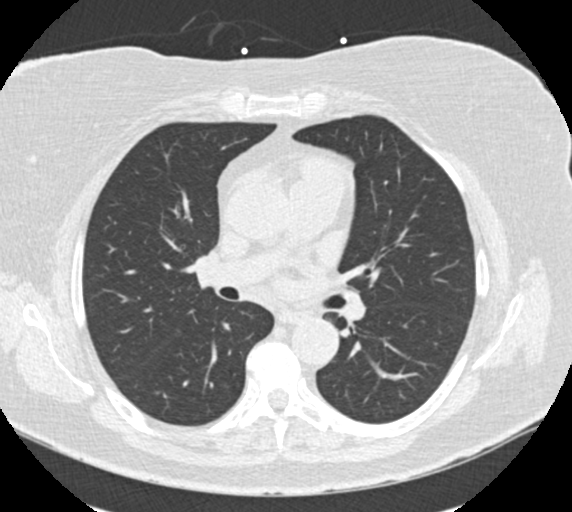

[Series 9: calcium scoring 2.00 br60 bestdiast 69% lungs · axial · 0.62mm/px · z∈[+1590,+1682]mm · 5 of 70 slices shown]
[im 12/70  vessel]
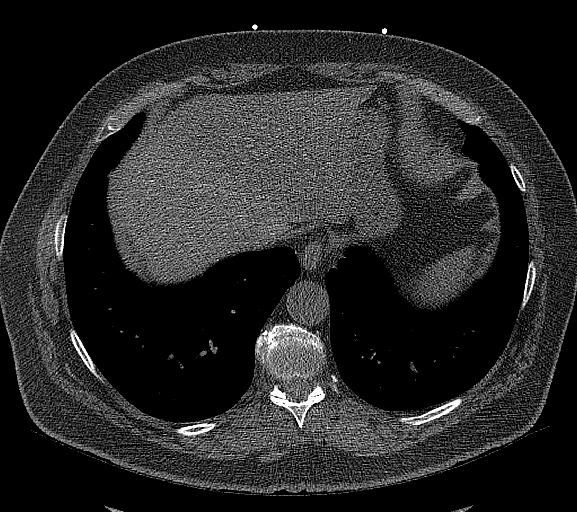
[im 24/70  vessel]
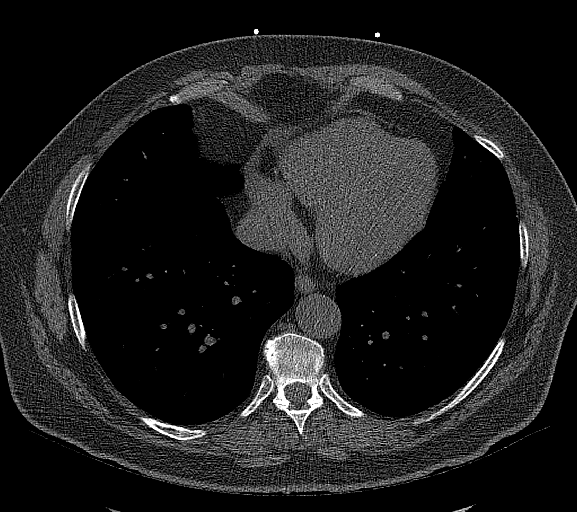
[im 35/70  vessel]
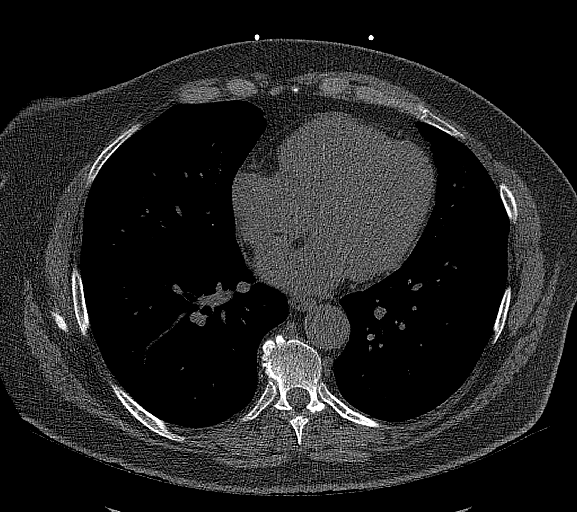
[im 47/70  vessel]
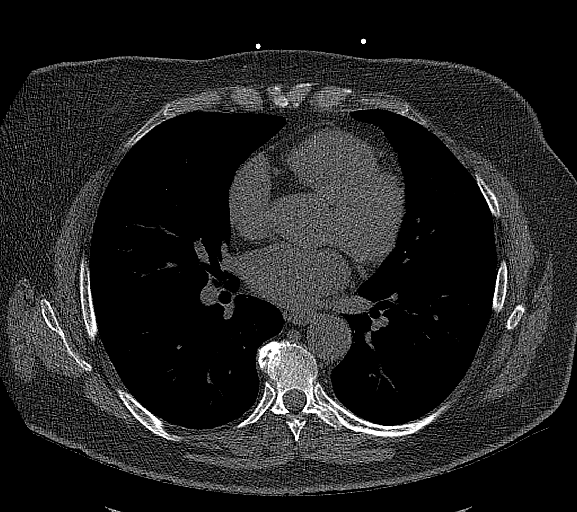
[im 58/70  vessel]
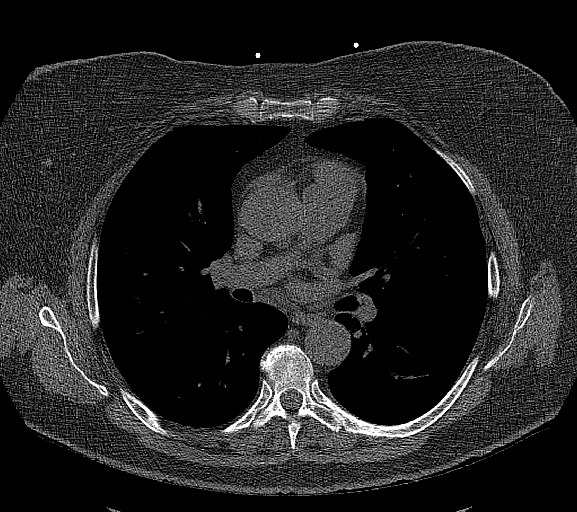

[14 of 20 positions shown; findings below may reference images not displayed]

FINDINGS: CORONARY CALCIUM SCORES:

Left Main: 0

LAD: 0

LCx: 0

RCA: 0

Total Agatston Score: 0

[HOSPITAL] percentile: 0

AORTA MEASUREMENTS:

Ascending Aorta: 36 mm

Descending Aorta: 26 mm

OTHER FINDINGS:

Heart is normal size. Aorta normal caliber. No adenopathy. No
confluent opacities or effusions. Insert for abdomen Chest wall soft
tissues are unremarkable. No acute bony abnormality.
IMPRESSION: No visible coronary artery calcifications. Total coronary calcium
score of 0.

No acute or significant extracardiac abnormality.

## 2022-07-21 ENCOUNTER — Other Ambulatory Visit: Payer: Self-pay

## 2022-07-21 ENCOUNTER — Encounter (HOSPITAL_BASED_OUTPATIENT_CLINIC_OR_DEPARTMENT_OTHER): Payer: Self-pay | Admitting: Internal Medicine

## 2022-07-21 ENCOUNTER — Ambulatory Visit: Payer: 59 | Attending: Internal Medicine | Admitting: Internal Medicine

## 2022-07-21 VITALS — BP 124/82 | HR 70 | Temp 97.2°F | Ht 65.87 in | Wt 198.9 lb

## 2022-07-21 DIAGNOSIS — R7303 Prediabetes: Secondary | ICD-10-CM | POA: Insufficient documentation

## 2022-07-21 DIAGNOSIS — Z135 Encounter for screening for eye and ear disorders: Secondary | ICD-10-CM | POA: Insufficient documentation

## 2022-07-21 DIAGNOSIS — M19041 Primary osteoarthritis, right hand: Secondary | ICD-10-CM | POA: Insufficient documentation

## 2022-07-21 DIAGNOSIS — F320 Major depressive disorder, single episode, mild: Secondary | ICD-10-CM | POA: Insufficient documentation

## 2022-07-21 DIAGNOSIS — M8589 Other specified disorders of bone density and structure, multiple sites: Secondary | ICD-10-CM | POA: Insufficient documentation

## 2022-07-21 DIAGNOSIS — F411 Generalized anxiety disorder: Secondary | ICD-10-CM | POA: Insufficient documentation

## 2022-07-21 DIAGNOSIS — E669 Obesity, unspecified: Secondary | ICD-10-CM | POA: Insufficient documentation

## 2022-07-21 DIAGNOSIS — F5101 Primary insomnia: Secondary | ICD-10-CM | POA: Insufficient documentation

## 2022-07-21 DIAGNOSIS — Z1283 Encounter for screening for malignant neoplasm of skin: Secondary | ICD-10-CM | POA: Insufficient documentation

## 2022-07-21 DIAGNOSIS — M19042 Primary osteoarthritis, left hand: Secondary | ICD-10-CM | POA: Insufficient documentation

## 2022-07-21 MED ORDER — ESCITALOPRAM 20 MG TABLET
20.0000 mg | ORAL_TABLET | Freq: Every day | ORAL | 6 refills | Status: DC
Start: 2022-07-21 — End: 2022-09-02

## 2022-07-21 MED ORDER — TRETINOIN 0.025 % TOPICAL CREAM
TOPICAL_CREAM | Freq: Every evening | CUTANEOUS | 1 refills | Status: DC
Start: 2022-07-21 — End: 2024-06-27

## 2022-07-21 MED ORDER — BUPROPION HCL XL 150 MG 24 HR TABLET, EXTENDED RELEASE
150.0000 mg | ORAL_TABLET | Freq: Every day | ORAL | 1 refills | Status: DC
Start: 2022-07-21 — End: 2022-09-02

## 2022-07-21 NOTE — Progress Notes (Signed)
[facility]  INTERNAL MEDICINE, [facility]  Operated by [facility]  Progress Note    Name: Deanna Parks MRN:  [id number]   Date: 07/21/2022 Age: 61 y.o.     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   Husband in [place].   Family in [place].    Wants to get get back into Yoga/exercise.     Depression - Wellbutrin started about 3 years ago. Lexapro long standing.   GAD - Lexparo x 15 years.   Not sleeping well.   Insomnia for 5-6 years.  Goes to bed at 9:30/10.  Reads before bedtime. Used Melatonin 1-2 times.     Public speaking - propranolol 10 mg prn. Three times yearly.     Previous had migraines. Not on medication currently since menopause.   Menopause at 44.  DEXA demonstrating osteopenia. Last DEXA in 2019.    Taking a Vitamin D/Calcium Supplement.     Calcium score of 0 on CT calcium.     Both parents deceased.   Sister recently diagnosed with leukemia that is being monitored. Aged 80.   Total 2 brothers and sister.      Colonoscopy in 2017.  07/19/2016. 10 year window. [facility].     Pre-diabetes on prior blood work.     Bilateral hand arthritis. Ongoing for 5-6 years ago.   Wears compression gloves at night.    Impacting playing.   Did complete Xrays - Osteoarthritis.     Last pap smear 11/2020 WNL.     Objective :  BP 124/82 (Site: Left, Patient Position: Sitting, Cuff Size: Adult)   Pulse 70   Temp 36.2 C (97.2 F) (Temporal)   Ht 1.673 m (5' 5.87")   Wt 90.2 kg (198 lb 13.7 oz)   SpO2 94%   BMI 32.23 kg/m     Data reviewed:  Current Outpatient Medications   Medication Sig    buPROPion (WELLBUTRIN XL) 150 mg extended release 24 hr tablet Take 1 Tablet (150 mg total) by mouth Once a day    docosahexaenoic acid/epa (FISH OIL ORAL) Take by mouth    escitalopram oxalate (LEXAPRO) 20 mg Oral Tablet Take 1 Tablet (20 mg total) by mouth Once a day    propranoloL (INDERAL) 10 mg Oral Tablet Take 1 Tablet (10 mg total)  by mouth Three times a day    Tretinoin (RETIN-A) 0.025 % Cream Apply topically Every night     Assessment/Plan    1. Mild major depression (CMS HCC) - symptoms under good control currently. Uses a light lamp daily.  Discussed importance of daily exercise on depression management. Decreasing Wellbutrin to see if this improves insomnia, Harjot will send an update on depression symptoms in 1 month.   - Decrease Wellbutrin to 150 mg Daily  - Continue Lexapro 20 mg daily     2. 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 Osteopenia of multiple sites - last DEXA in 2019, T score in spine -2.0.   Dameka will send report.  Repeating DEXA now.   - BONE DENSITOMETRY COMPARISON; Future  5. Prediabetes - unclear what A1c's previously were, notes indicate prior pre-diabetes - Alyssa will send prior labs.      6. Screening for skin cancer  - Refer to [facility]; Future    7. Screening for eye condition  - Refer to [facility]; Future    Hand OA - scheduled tylenol + prn voltaren gel.     Will decrease Wellbutrin to 150 - reassess in 1 month. Consider tapering off if insomnia is improved/depression controlled.   Start melatonin 1.5 hours before bed  Goal treadmill / yoga 4 times weekly.     Christalyn will send PDF copies of prior colonoscopy, Mammogram, vaccines, DEXA etc.     Total time 40 minutes.   Pending update in 1 month - RTC in 6 months for annual physical exam.     [name], MD

## 2022-07-21 NOTE — Patient Instructions (Addendum)
Decrease Wellbutrin to 150 mg daily.   Message me in 1 month with an update.   Exercise - Yoga and/or treadmill at least 4 times weekly  Melatonin nightly (5 mg) - 1.5 hours before sleep time.   Schedule tylenol [phone] mg) up three times daily.  For bad arthritis days - consider VOLTAREN gel (over the counter on your hand joints).   Send me a PDF of your colonoscopy report (and pathology report if polyps were removed).  Send me your DEXA scan and Mammogram pdf.   Send me your last report of lipids (cholesterol)and A1c.   Send me your vaccine report (including Shingles).

## 2022-09-02 ENCOUNTER — Other Ambulatory Visit (HOSPITAL_BASED_OUTPATIENT_CLINIC_OR_DEPARTMENT_OTHER): Payer: Self-pay | Admitting: Internal Medicine

## 2022-09-02 MED ORDER — BUPROPION HCL XL 150 MG 24 HR TABLET, EXTENDED RELEASE
150.0000 mg | ORAL_TABLET | Freq: Every day | ORAL | 1 refills | Status: DC
Start: 2022-09-02 — End: 2023-01-27

## 2022-09-03 MED ORDER — ESCITALOPRAM 20 MG TABLET
20.0000 mg | ORAL_TABLET | Freq: Every day | ORAL | 3 refills | Status: DC
Start: 2022-09-03 — End: 2023-01-27

## 2022-09-06 ENCOUNTER — Encounter (HOSPITAL_BASED_OUTPATIENT_CLINIC_OR_DEPARTMENT_OTHER): Payer: Self-pay | Admitting: Internal Medicine

## 2022-09-13 NOTE — Telephone Encounter (Signed)
Mehaffey, Apolonio Schneiders, MD  [facility]  Please call pt - she did not read message.  Need records sent to [place] (pt reported that she had copies) - otherwise please send release to prior clinic site to get records.          Previous Messages       ----- Message -----  From: Ellyn Hack, MD  Sent: 09/06/2022  To: Johnney Killian  Subject: Unread Message Notification                      Good morning Claiborne Billings,  Friendly reminder - can you send me copies of your prior mammogram, colonoscopy and DEXA (bone density scan).  If you aren't able to find these, we can send a request for records to your prior doctor as well,  Apolonio Schneiders [facility]      Attempted to call the patient, Aviendha, regarding Dr. Ilona Sorrel message. Received no answer. Left a message requesting a call back to [phone].  Corbin Ade, RN

## 2022-09-21 ENCOUNTER — Ambulatory Visit: Payer: 59 | Attending: Student in an Organized Health Care Education/Training Program | Admitting: DERMATOLOGY

## 2022-09-21 ENCOUNTER — Other Ambulatory Visit: Payer: Self-pay

## 2022-09-21 VITALS — Temp 98.2°F | Ht 65.75 in | Wt 206.4 lb

## 2022-09-21 DIAGNOSIS — L570 Actinic keratosis: Secondary | ICD-10-CM | POA: Insufficient documentation

## 2022-09-21 DIAGNOSIS — L219 Seborrheic dermatitis, unspecified: Secondary | ICD-10-CM | POA: Insufficient documentation

## 2022-09-21 DIAGNOSIS — L851 Acquired keratosis [keratoderma] palmaris et plantaris: Secondary | ICD-10-CM

## 2022-09-21 DIAGNOSIS — Z808 Family history of malignant neoplasm of other organs or systems: Secondary | ICD-10-CM

## 2022-09-21 DIAGNOSIS — Z1283 Encounter for screening for malignant neoplasm of skin: Secondary | ICD-10-CM | POA: Insufficient documentation

## 2022-09-21 DIAGNOSIS — L578 Other skin changes due to chronic exposure to nonionizing radiation: Secondary | ICD-10-CM

## 2022-09-21 DIAGNOSIS — L821 Other seborrheic keratosis: Secondary | ICD-10-CM

## 2022-09-21 DIAGNOSIS — L218 Other seborrheic dermatitis: Secondary | ICD-10-CM

## 2022-09-21 MED ORDER — KETOCONAZOLE 2 % TOPICAL CREAM
TOPICAL_CREAM | Freq: Every day | CUTANEOUS | 3 refills | Status: DC
Start: 2022-09-21 — End: 2023-12-28

## 2022-09-21 NOTE — Progress Notes (Signed)
Dermatology Clinic, [facility]  [place] [address]  [phone]    Date:   09/21/2022  Name: Deanna Parks  Age: 61 y.o.  Last visit: Visit date not found    Chief complaint: skin check    HPI  Deanna Parks is a 61 y.o. female with family hx of melanoma in her father presenting for a total body skin exam with no specific concerns. Patient states that she had a lesion on her nose treated with cryotherapy in the past. She also notes scaly lesions on her legs. She also states that she has dry skin around the eyebrows. No other new, changing, bleeding, or symptomatic lesions or moles. No other skin-related complaints. Patient uses sun screen occasionally, and she denies tanning bed use but does have a history of sun exposure.      ROS   Constitutional: Negative for chills and fever.   Skin: Negative for itching and rash.       Current Medications  Outpatient Encounter Medications as of 09/21/2022   Medication Sig Dispense Refill    buPROPion (WELLBUTRIN XL) 150 mg extended release 24 hr tablet Take 1 Tablet (150 mg total) by mouth Once a day 90 Tablet 1    diclofenac sodium (VOLTAREN) 1 % Gel Apply 2 g topically Four times a day      docosahexaenoic acid/epa (FISH OIL ORAL) Take by mouth      escitalopram oxalate (LEXAPRO) 20 mg Oral Tablet Take 1 Tablet (20 mg total) by mouth Once a day 90 Tablet 3    ketoconazole (NIZORAL) 2 % Cream Apply topically Once a day 30 g 3    propranoloL (INDERAL) 10 mg Oral Tablet Take 1 Tablet (10 mg total) by mouth Three times a day      Tretinoin (RETIN-A) 0.025 % Cream Apply topically Every night 45 g 1    [DISCONTINUED] buPROPion (WELLBUTRIN XL) 150 mg extended release 24 hr tablet Take 1 Tablet (150 mg total) by mouth Once a day 30 Tablet 1    [DISCONTINUED] escitalopram oxalate (LEXAPRO) 20 mg Oral Tablet Take 1 Tablet (20 mg total) by mouth Once a day 30 Tablet 6     No facility-administered encounter medications on file as of  09/21/2022.        No Known Allergies     No past medical history on file.       Physical Exam  Vitals:    Temp:  [36.8 C (98.2 F)] 36.8 C (98.2 F) (11/14 1225)   Vitals:    09/21/22 1225   Temp: 36.8 C (98.2 F)   Weight: 93.6 kg (206 lb 5.6 oz)   Height: 1.67 m (5' 5.75")        Physical Exam  HENT:      Head:     Skin:              Constitutional: she appears well-developed and well-nourished.   Skin: Skin is warm and dry.     General skin exam was performed including head, neck, anterior/posterior trunk, bilateral upper and lower extremities and revealed no areas of concern other than those documented.     Assessment and Plan  #Actinic keratoses (see #1 on head above)    - Discussed that this is considered a premalignant lesion with potential for development of squamous cell carcinoma.   - Discussed treatment options including cryotherapy, topical therapy with 5-fluorouracil, or field therapy with photodynamic therapy (i.e.  PDT.)   - Procedure: After verbal consent: Freeze thaw freeze 2 Lesion(s) treated with freeze/thaw/refreeze with liquid nitrogen.  Patient was informed of the risks of the procedure including pain, irritation, blister, hypopigmentation, hyperpigmentation, and recurrence. Patient understood the risks and agrees with the procedure. No complications following procedure. Patient tolerated the procedure well. Advised to call with any future problems.   - The preparation and execution of this procedure took more than 5 minutes in total.    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  - Start ketoconazole cream twice daily as needed to affected areas on the face.    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 Discussed AmLactin, patient declined    #Sun Exposure  Skin Cancer Screening   - Recommend protection from ultraviolet radiation from the sun and avoidance of sunburns and tanning  beds. Recommend broad spectrum sunscreen daily with at least SPF 30, especially one that contains zinc and titanium. Reapply sunscreen every two hours. It is important to wear protective clothing and avoid the mid-day sun (approx 10AM till 2PM) when possible   - Recommend self-assessment using the ABCDE's of melanoma to locate areas of concern: Asymmetry, Border irregularity, Color variation, Diameter (>69mm), Evolution of lesion.     RTC 12 months or sooner if needed.    Corbin Ade, MD 09/21/2022 12:44  PGY-3 Dermatology  [facility]      See resident's note for details.  I saw and examined the patient and agree with the resident's findings and plan as written except as noted, and I was present and supervised/observed the entirety of all minor procedures lasting less than 5 minutes, and the critical portion of all major procedures.     Katha Hamming, MD

## 2022-10-24 ENCOUNTER — Encounter (HOSPITAL_BASED_OUTPATIENT_CLINIC_OR_DEPARTMENT_OTHER): Payer: Self-pay | Admitting: Internal Medicine

## 2022-10-24 DIAGNOSIS — Z1231 Encounter for screening mammogram for malignant neoplasm of breast: Secondary | ICD-10-CM

## 2022-10-25 NOTE — Telephone Encounter (Signed)
From: Johnney Killian  To: Ellyn Hack  Sent: 10/24/2022 10:05 AM EST  Subject: Mammogram    There is no [facility] center nearby. Where do you recommend I schedule my annual mammogram, which is due early 2024.    Thanks and have a great holiday. Claiborne Billings

## 2022-10-26 ENCOUNTER — Other Ambulatory Visit (HOSPITAL_BASED_OUTPATIENT_CLINIC_OR_DEPARTMENT_OTHER): Payer: 59

## 2022-12-13 ENCOUNTER — Inpatient Hospital Stay
Admission: RE | Admit: 2022-12-13 | Discharge: 2022-12-13 | Disposition: A | Discharge status: Home / Self Care | Payer: 59 | Source: Ambulatory Visit | Attending: Internal Medicine | Admitting: Internal Medicine

## 2022-12-13 ENCOUNTER — Other Ambulatory Visit: Payer: Self-pay

## 2022-12-13 ENCOUNTER — Encounter (HOSPITAL_BASED_OUTPATIENT_CLINIC_OR_DEPARTMENT_OTHER): Payer: Self-pay

## 2022-12-13 DIAGNOSIS — Z1231 Encounter for screening mammogram for malignant neoplasm of breast: Secondary | ICD-10-CM

## 2022-12-14 DIAGNOSIS — Z1231 Encounter for screening mammogram for malignant neoplasm of breast: Secondary | ICD-10-CM

## 2023-01-13 ENCOUNTER — Encounter (INDEPENDENT_AMBULATORY_CARE_PROVIDER_SITE_OTHER): Payer: Self-pay

## 2023-01-19 ENCOUNTER — Ambulatory Visit (HOSPITAL_BASED_OUTPATIENT_CLINIC_OR_DEPARTMENT_OTHER): Payer: Self-pay | Admitting: Student in an Organized Health Care Education/Training Program

## 2023-01-19 ENCOUNTER — Ambulatory Visit (HOSPITAL_BASED_OUTPATIENT_CLINIC_OR_DEPARTMENT_OTHER): Payer: Self-pay | Admitting: Internal Medicine

## 2023-01-21 ENCOUNTER — Encounter (HOSPITAL_BASED_OUTPATIENT_CLINIC_OR_DEPARTMENT_OTHER): Payer: Self-pay

## 2023-01-25 ENCOUNTER — Ambulatory Visit (HOSPITAL_BASED_OUTPATIENT_CLINIC_OR_DEPARTMENT_OTHER): Payer: 59 | Admitting: Internal Medicine

## 2023-01-27 ENCOUNTER — Ambulatory Visit: Payer: 59 | Attending: Internal Medicine | Admitting: Internal Medicine

## 2023-01-27 ENCOUNTER — Other Ambulatory Visit (HOSPITAL_BASED_OUTPATIENT_CLINIC_OR_DEPARTMENT_OTHER): Payer: Self-pay | Admitting: Internal Medicine

## 2023-01-27 ENCOUNTER — Encounter (HOSPITAL_BASED_OUTPATIENT_CLINIC_OR_DEPARTMENT_OTHER): Payer: Self-pay | Admitting: Internal Medicine

## 2023-01-27 ENCOUNTER — Other Ambulatory Visit: Payer: Self-pay

## 2023-01-27 ENCOUNTER — Other Ambulatory Visit (HOSPITAL_BASED_OUTPATIENT_CLINIC_OR_DEPARTMENT_OTHER): Payer: 59

## 2023-01-27 ENCOUNTER — Inpatient Hospital Stay (HOSPITAL_BASED_OUTPATIENT_CLINIC_OR_DEPARTMENT_OTHER)
Admission: RE | Admit: 2023-01-27 | Discharge: 2023-01-27 | Disposition: A | Discharge status: Home / Self Care | Payer: 59 | Source: Ambulatory Visit

## 2023-01-27 VITALS — BP 140/80 | HR 71 | Temp 96.3°F | Ht 65.71 in | Wt 201.1 lb

## 2023-01-27 DIAGNOSIS — R7303 Prediabetes: Secondary | ICD-10-CM

## 2023-01-27 DIAGNOSIS — E669 Obesity, unspecified: Secondary | ICD-10-CM | POA: Insufficient documentation

## 2023-01-27 DIAGNOSIS — Z1283 Encounter for screening for malignant neoplasm of skin: Secondary | ICD-10-CM

## 2023-01-27 DIAGNOSIS — Z0000 Encounter for general adult medical examination without abnormal findings: Secondary | ICD-10-CM | POA: Insufficient documentation

## 2023-01-27 DIAGNOSIS — Z135 Encounter for screening for eye and ear disorders: Secondary | ICD-10-CM

## 2023-01-27 DIAGNOSIS — F320 Major depressive disorder, single episode, mild: Secondary | ICD-10-CM

## 2023-01-27 DIAGNOSIS — M8589 Other specified disorders of bone density and structure, multiple sites: Secondary | ICD-10-CM

## 2023-01-27 DIAGNOSIS — M19041 Primary osteoarthritis, right hand: Secondary | ICD-10-CM

## 2023-01-27 DIAGNOSIS — Z13820 Encounter for screening for osteoporosis: Secondary | ICD-10-CM | POA: Insufficient documentation

## 2023-01-27 DIAGNOSIS — R030 Elevated blood-pressure reading, without diagnosis of hypertension: Secondary | ICD-10-CM | POA: Insufficient documentation

## 2023-01-27 DIAGNOSIS — F411 Generalized anxiety disorder: Secondary | ICD-10-CM | POA: Insufficient documentation

## 2023-01-27 DIAGNOSIS — F325 Major depressive disorder, single episode, in full remission: Secondary | ICD-10-CM | POA: Insufficient documentation

## 2023-01-27 DIAGNOSIS — F5101 Primary insomnia: Secondary | ICD-10-CM

## 2023-01-27 LAB — COMPREHENSIVE METABOLIC PANEL, NON-FASTING
ALBUMIN: 3.8 g/dL (ref 3.4–4.8)
ALKALINE PHOSPHATASE: 82 U/L (ref 50–130)
ALT (SGPT): 32 U/L — ABNORMAL HIGH (ref 8–22)
ANION GAP: 8 mmol/L (ref 4–13)
AST (SGOT): 21 U/L (ref 8–45)
BILIRUBIN TOTAL: 0.4 mg/dL (ref 0.3–1.3)
BUN/CREA RATIO: 13 (ref 6–22)
BUN: 10 mg/dL (ref 8–25)
CALCIUM: 10 mg/dL (ref 8.6–10.3)
CHLORIDE: 103 mmol/L (ref 96–111)
CO2 TOTAL: 28 mmol/L (ref 23–31)
CREATININE: 0.78 mg/dL (ref 0.60–1.05)
ESTIMATED GFR - FEMALE: 85 mL/min/BSA (ref >=60)
GLUCOSE: 92 mg/dL (ref 65–125)
POTASSIUM: 4.5 mmol/L (ref 3.5–5.1)
PROTEIN TOTAL: 7 g/dL (ref 6.0–8.0)
SODIUM: 139 mmol/L (ref 136–145)

## 2023-01-27 LAB — LIPID PANEL
CHOL/HDL RATIO: 3.5
CHOLESTEROL: 209 mg/dL — ABNORMAL HIGH (ref 100–200)
HDL CHOL: 60 mg/dL (ref >=50)
LDL CALC: 131 mg/dL — ABNORMAL HIGH (ref <100)
NON-HDL: 149 mg/dL (ref <=190)
TRIGLYCERIDES: 100 mg/dL (ref <150)
VLDL CALC: 18 mg/dL (ref <30)

## 2023-01-27 LAB — CBC
HCT: 41.9 % (ref 34.8–46.0)
HGB: 14.2 g/dL (ref 11.5–16.0)
MCH: 29.7 pg (ref 26.0–32.0)
MCHC: 33.9 g/dL (ref 31.0–35.5)
MCV: 87.7 fL (ref 78.0–100.0)
MPV: 9.2 fL (ref 8.7–12.5)
PLATELETS: 363 10*3/uL (ref 150–400)
RBC: 4.78 10*6/uL (ref 3.85–5.22)
RDW-CV: 14.1 % (ref 11.5–15.5)
WBC: 8 10*3/uL (ref 3.7–11.0)

## 2023-01-27 MED ORDER — ESCITALOPRAM 20 MG TABLET
20.0000 mg | ORAL_TABLET | Freq: Every day | ORAL | 3 refills | Status: DC
Start: 2023-01-27 — End: 2023-04-15

## 2023-01-27 MED ORDER — BUPROPION HCL XL 150 MG 24 HR TABLET, EXTENDED RELEASE
150.0000 mg | ORAL_TABLET | Freq: Every day | ORAL | 3 refills | Status: DC
Start: 2023-01-27 — End: 2023-04-15

## 2023-01-27 NOTE — Progress Notes (Signed)
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 Stress at work improved.   Nutrition - mostly cooking at home, fresh ingredients;   Exercising by walking on her desk treadmill. Yoga intermittently.  Fell in December while ice skating - left elbow and left hip injury; Still painful in left elbow   Depression , GAD - Decreased Wellbutrin at last OV in September to improve insomnia.  Overall better sleep with decreased Wellbutrin;   Class I Obesity - stable weight.  Osteopenia on DEXA in 2019.  Will need to Reschedule DEXA;   Colonoscopy completed 2017, 10 year follow up recommended. Need records of this - Deanna Parks, [place].   Pre-diabetes longstanding.   Takes Vitamin D supplement; 500 mg Calcium supplement daily;   Last pap smear in 2022.   Using 5 mg Propranolol prior to public speaking.   Dental appt scheduled.   Eye exam completed in September.   Not eating breakfast.  Small sandwich; Eating dinner by 6:30;    Was previously on Lisinopril - caused a cough. Switched to Losartan.   Hand pain, arthritis, improved with use of prn Voltaren, oral Tylenol.   Will start kayaking when weather improves.     10 pt ROS completed, negative unless stated in HPI.  I reviewed and updated Deanna Parks's problem list.   I reviewed and updated Deanna Parks's past medical, past surgical, family and social history.    Current Outpatient Medications   Medication Sig    buPROPion (WELLBUTRIN XL) 150 mg extended release 24 hr tablet Take 1 Tablet (150 mg total) by mouth Once a day    diclofenac sodium (VOLTAREN) 1 % Gel Apply 2 g topically Four times a day    docosahexaenoic acid/epa (FISH OIL ORAL) Take by mouth    escitalopram oxalate (LEXAPRO) 20 mg Oral Tablet Take 1 Tablet (20 mg total) by mouth Once a day    ketoconazole (NIZORAL) 2  % Cream Apply topically Once a day (Patient not taking: Reported on 01/27/2023)    propranoloL (INDERAL) 10 mg Oral Tablet Take 1 Tablet (10 mg total) by mouth Three times a day    Tretinoin (RETIN-A) 0.025 % Cream Apply topically Every night     Objective :  BP (!) 140/80 (Site: Right, Patient Position: Sitting, Cuff Size: Adult Large)   Pulse 71   Temp (!) 35.7 C (96.3 F) (Temporal)   Ht 1.669 m (5' 5.71")   Wt 91.2 kg (201 lb 1 oz)   SpO2 94%   BMI 32.74 kg/m     Physical Exam  Constitutional:       Appearance: Normal appearance.   HENT:      Head: Normocephalic and atraumatic.      Right Ear: Tympanic membrane normal.      Left Ear: Tympanic membrane normal.   Eyes:      Extraocular Movements: Extraocular movements intact.   Cardiovascular:      Rate and Rhythm: Normal rate and regular rhythm.   Pulmonary:      Effort: Pulmonary effort is normal. No respiratory distress.      Breath sounds: Normal breath sounds.   Abdominal:      General: Bowel sounds are normal. There is no distension.   Musculoskeletal:  General: No swelling. Normal range of motion.      Cervical back: Neck supple.   Lymphadenopathy:      Cervical: No cervical adenopathy.   Skin:     General: Skin is warm and dry.   Neurological:      General: No focal deficit present.      Mental Status: She is alert and oriented to person, place, and time.   Psychiatric:         Mood and Affect: Mood normal.         Behavior: Behavior normal.       Assessment/Plan    1. Annual physical exam - discussed increasing exercise;  HM up to date - need to get records [facility];  Labs as below;  3-4 alcoholic drinks weekly; Nonsmoker;  Mental health under good control with current meds;    - CBC; Future  - COMPREHENSIVE METABOLIC PANEL, NON-FASTING; Future  - LIPID PANEL; Future    2. Elevated BP without diagnosis of hypertension- BP elevated on re-check.  Will send BP MyChart flowsheet;  Tracking for 1-2 weeks before deciding to initiate  anti-hypertensive therapy;  In the past was on lisinopril - caused a cough.  Losartan subsequently tolerated well, but ultimately BP normalized and losartan was discontinued.   - MYCHART BP FLOWSHEET    3. Prediabetes   - Hemoglobin A1C; Future    4. Osteopenia of multiple sites - Calcium supplementation daily (500 mg) as well as Vit D supplement;  Discussed importance of exercise and light weights on bone density improvement;   5. Screening for osteoporosis- DEXA ordered;  Due to be repeated given DEXA in 2019 with osteopenia.     6. 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 GAD (generalized anxiety disorder) - anxiety and depression under good control with Lexapro and Wellbutrin;  Improved insomnia with decreased Wellbutrin  - Continue Lexapro 20 mg  - Continue Wellbutrin 150 mg XL daily  - SAD lamp also managing symptoms well;     8. Obesity, Class I, BMI 30-34.9 - Charlestine would like to work on weight loss over next year;  Discussed nutrition and exercise changes that can assist in this; Will consider medication depending on A1c;     Mammogram completed in Feb 2024.   Records should be coming within 1 week (mailed).     Follow up appt timing pending labs and BP flow sheet.     Ellyn Hack, MD

## 2023-01-27 NOTE — Patient Instructions (Signed)
Check your blood pressures 1-2 times daily and send via MyChart.     Please ensure we have your records (colonoscopy report, pap smear, vaccines).     Increase Exercise.     Schedule your DEXA (bone density).

## 2023-01-28 LAB — HGA1C (HEMOGLOBIN A1C WITH EST AVG GLUCOSE)
ESTIMATED AVERAGE GLUCOSE: 111 mg/dL
HEMOGLOBIN A1C: 5.5 % (ref 4.0–5.6)

## 2023-01-29 DIAGNOSIS — M8589 Other specified disorders of bone density and structure, multiple sites: Secondary | ICD-10-CM

## 2023-01-31 ENCOUNTER — Telehealth (HOSPITAL_BASED_OUTPATIENT_CLINIC_OR_DEPARTMENT_OTHER): Payer: Self-pay | Admitting: Internal Medicine

## 2023-01-31 ENCOUNTER — Ambulatory Visit (HOSPITAL_BASED_OUTPATIENT_CLINIC_OR_DEPARTMENT_OTHER): Payer: 59 | Admitting: Internal Medicine

## 2023-01-31 NOTE — Telephone Encounter (Signed)
Patient-Entered Data   FW: New results from patient entered flowsheet  Due: In 2 days Received: Today  Mehaffey, Apolonio Schneiders, MD  P Mgm Utc Cnc Clinical Support  Please see if pt can do a virtual visit with me today - 4 pm to add on antihypertensive therapy based on her BP flowsheet documentation          Previous Messages       ----- Message -----  From: SYSTEM  Sent: 01/31/2023   8:06 AM EDT  To: Ellyn Hack, MD  Subject: New results from patient entered flowsheet         Susen's recent Home Blood Pressure Flowsheet readings (past 60 days):  Time Taken Time Submitted Systolic Diastolic Current Pulse BP Cuff Type   01/31/2023  8:00 AM 01/31/2023  8:06 AM 175 100 64 Automatic     View all of Alysia's readings.     Medical Group Practice  [place], [place] [address]     PATIENT NAME: Janlyn Prorok  MRN: [id number]  DOB: October 18, 1961     3.25.2024  09:58    I called the patient. She is not available at 4:00 PM today for a virtual visit due to her work schedule. She can do 4:30 PM today though. I added her on for a video visit, but informed her I would send a message to make sure you are okay with this time instead.    Clayton Lefort, BSN RN  Clinical Nurse Coordinator

## 2023-01-31 NOTE — Telephone Encounter (Signed)
Called patient at [phone] per provider request to reschedule today's appointment (01/31/2023 at 4:30 pm) to tomorrow (02/01/2023 @ 2:40 pm). Patient confirmed name and date of birth, patient was agreeable to change in appointment, details were relayed. Patient verbalized an understanding and had no further questions.    Molli Posey, RN

## 2023-02-01 ENCOUNTER — Ambulatory Visit: Payer: 59 | Attending: Internal Medicine | Admitting: Internal Medicine

## 2023-02-01 ENCOUNTER — Encounter (HOSPITAL_BASED_OUTPATIENT_CLINIC_OR_DEPARTMENT_OTHER): Payer: Self-pay | Admitting: Internal Medicine

## 2023-02-01 DIAGNOSIS — I10 Essential (primary) hypertension: Secondary | ICD-10-CM

## 2023-02-01 DIAGNOSIS — M8589 Other specified disorders of bone density and structure, multiple sites: Secondary | ICD-10-CM

## 2023-02-01 MED ORDER — LOSARTAN 50 MG TABLET
50.0000 mg | ORAL_TABLET | Freq: Every day | ORAL | 0 refills | Status: DC
Start: 2023-02-01 — End: 2023-02-17

## 2023-02-01 NOTE — Progress Notes (Signed)
INTERNAL MEDICINE, [facility]  [place] [address]  Operated by [facility]  Video Visit     Name: Deanna Parks  MRN: [id number]    Date: 02/01/2023  Age: 62 y.o.                            Patient's location: [place] [address]   Patient/family aware of provider location: Yes  Patient/family consent for video visit: Yes  Interview and observation performed by: Ellyn Hack, MD    Chief Complaint: Hypertension    History of Present Illness:  Deanna Parks is a 62 y.o. female   Checking Bps at home - consistently high.  Documenting in BP flowsheet.  Previously was on losartan.     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    Assessment/Plan:    1. Essential hypertension - restarting Losartan 50 mg daily for BP management.  Will monitor BP flowsheet and consider increasing pending Bps over the next week.  Future BMP order placed - Xanthia will have this completed within 1-2 weeks.   - BP nurse visit check with home cuff in next 1-2 weeks (check accuracy of cuff)   - Basic Metabolic Panel Non fasting; Future    Reviewed recent blood work and DEXA as well.       ICD-10-CM    1. Essential hypertension  99991111 Basic Metabolic Panel Non fasting        Orders Placed This Encounter    Basic Metabolic Panel Non fasting    losartan (COZAAR) 50 mg Oral Tablet     Ellyn Hack, MD

## 2023-02-02 ENCOUNTER — Ambulatory Visit (INDEPENDENT_AMBULATORY_CARE_PROVIDER_SITE_OTHER): Payer: Self-pay | Admitting: Audiologist

## 2023-02-02 ENCOUNTER — Other Ambulatory Visit: Payer: Self-pay

## 2023-02-02 DIAGNOSIS — H903 Sensorineural hearing loss, bilateral: Secondary | ICD-10-CM

## 2023-02-02 NOTE — Progress Notes (Signed)
AUDIOLOGY, [facility]  [place] [address]    Progress Note    Name: Deanna Parks MRN:  [id number]   Date: 02/02/2023 Age: 63 y.o.     Chief Complaint: Hearing Loss     Subjective:   Deanna Parks was seen today for a hearing aid evaluation. She reported noticeable changes in her hearing "in consonants". She stated that she has trouble hearing her husband at home and is tired of saying "what". Thresholds will be checked today as the previous hearing test is past 6 months    Objective:  Otoscopy revealed clear ear canals and visible tympanic membranes AU.     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     Discussed hearing aid styles, manufacturers and connectivity options. 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     Assessment:  She is interested in purchasing hearing aids through the clinic.    Plan:  Ordering Oticon INTENT 3 miniRITE Rs in silver gray with size 2 60 gain receivers with open 65mm domes.  When hearing aids arrive please contact Ms. Huntsman to schedule a hearing aid fitting appointment.  Insurance benefit check added to [facility] portal, payment should be accepted at the time of the fitting.    Care was assisted today by graduate students Mingo Amber, Lacy Duverney, Roderic Palau and undergraduate student Eda Keys as supervised by Dr. Tereasa Coop.      Tereasa Coop, CCC-A

## 2023-02-03 ENCOUNTER — Encounter (INDEPENDENT_AMBULATORY_CARE_PROVIDER_SITE_OTHER): Payer: Self-pay | Admitting: Audiologist

## 2023-02-03 DIAGNOSIS — Z974 Presence of external hearing-aid: Secondary | ICD-10-CM | POA: Insufficient documentation

## 2023-02-03 NOTE — Progress Notes (Signed)
AUDIOLOGY, APPLIED HUMAN SCIENCES BUILDING  [place] [address]    Progress Note    Name: Stephanne Salvia MRN:  [id number]   Date: 02/03/2023 Age: 62 y.o.     Problem   Wears Hearing Aid in Both Ears    Hearing Aids:                Make: Oticon                Model: Intent 3 miniRITE R                Domes: 46mm open base                Wax Filter: Prowax mini fit                Receiver (Length/Power): size 2 60 gain                Battery Size: Rechargeable     Serial Numbers:         Right: [id number]         Left: [id number]     Trial Period Expires:   Warranty Period Expires: 03/03/2026          Hearing aid(s) were verified using the test box, and results saved in [place].    Hearing aids(s) were labeled and placed on the pending shelf to wait for hearing aid fitting appointment.     Hearing aid fitting will be scheduled.     Tereasa Coop, CCC-A

## 2023-02-04 ENCOUNTER — Other Ambulatory Visit (HOSPITAL_BASED_OUTPATIENT_CLINIC_OR_DEPARTMENT_OTHER): Payer: 59

## 2023-02-04 ENCOUNTER — Other Ambulatory Visit: Payer: Self-pay

## 2023-02-04 ENCOUNTER — Ambulatory Visit: Payer: 59 | Attending: Internal Medicine

## 2023-02-04 ENCOUNTER — Telehealth (HOSPITAL_BASED_OUTPATIENT_CLINIC_OR_DEPARTMENT_OTHER): Payer: Self-pay | Admitting: Internal Medicine

## 2023-02-04 VITALS — BP 148/82

## 2023-02-04 DIAGNOSIS — I10 Essential (primary) hypertension: Secondary | ICD-10-CM | POA: Insufficient documentation

## 2023-02-04 LAB — BASIC METABOLIC PANEL
ANION GAP: 7 mmol/L (ref 4–13)
BUN/CREA RATIO: 19 (ref 6–22)
BUN: 14 mg/dL (ref 8–25)
CALCIUM: 9.7 mg/dL (ref 8.6–10.3)
CHLORIDE: 106 mmol/L (ref 96–111)
CO2 TOTAL: 29 mmol/L (ref 23–31)
CREATININE: 0.74 mg/dL (ref 0.60–1.05)
ESTIMATED GFR - FEMALE: >90 mL/min/BSA (ref >=60)
GLUCOSE: 87 mg/dL (ref 65–125)
POTASSIUM: 3.5 mmol/L (ref 3.5–5.1)
SODIUM: 142 mmol/L (ref 136–145)

## 2023-02-04 NOTE — Telephone Encounter (Signed)
Mehaffey, Apolonio Schneiders, MD  You12 minutes ago (11:41 AM)     Thank you, agree with waiting for manual - can increase to 100 at the time if BP is above Q000111Q systolic

## 2023-02-04 NOTE — Nursing Note (Signed)
Patient arrived to our office for a blood pressure check, and to compare manual blood pressure regarding to at-home automatic cuff    Blood pressure in office manually on right arm: 148/82 @ 15:32    Blood pressure in office automatically with at home cuff, right arm: 165/78 @ 15:37    Blood pressure on office automatically with at home, left arm: 164/91 @ 1540     Please MyChart message patient to advise.    Molli Posey, RN

## 2023-02-04 NOTE — Telephone Encounter (Signed)
Patient-Entered Data   FW: Abnormal results from patient entered flowsheet  Due: In 4 days Received: Today  Mehaffey, Apolonio Schneiders, MD  P Mgm Utc Cnc Clinical Support  Please call pt - can she increase Losartan to 100 mg  BP check in the office this afternoon?  Unsure if her home cuff is accurate          Previous Messages       ----- Message -----  From: SYSTEM  Sent: 02/04/2023   8:15 AM EDT  To: Ellyn Hack, MD  Subject: Abnormal results from patient entered [place] has submitted abnormal  Home Blood Pressure Flowsheet data.  Time data was submitted: 02/04/2023  8:15 AM     Time Taken Diastolic Systolic Current Pulse BP Cuff Type   02/04/2023  8:15 AM 100 181  77 Automatic     View all of Dorrene's readings.   Manage these messages        Medical Group Practice  [place],  [address]     PATIENT NAME: Deanna Parks   MRN: [id number]  DOB: 05/19/1961     3.29.2024  11:19    Called the patient. Added on for a 2:45 PM nurse visit to check her blood pressure. Will bring her cuff to compare. Does not want to increase Losartan until she sees the manual blood pressure reading.     Clayton Lefort, BSN RN  Clinical Nurse Coordinator

## 2023-02-04 NOTE — Telephone Encounter (Signed)
Medical Group Practice  [place], [place] [address]       PATIENT NAME: Deanna Parks  MRN: [id number]  DOB: 1960/12/27        Date: 02/04/2023  Time: 16:14      Called and informed the patient of Dr. Ilona Sorrel recommendations. Also, recommended for the patient to purchase a new blood pressure machine since her old machine is reading inaccurately. Patient verbalized understanding.     Mehaffey, Apolonio Schneiders, MD  Clayton Lefort, RN4 minutes ago (4:09 PM)     Please have her increase to 75 mg daily.  Please continue sending BPs 1-2 times daily thanks          Ivette Loyal, BSN RN  Clinical Nurse Coordinator

## 2023-02-04 NOTE — Telephone Encounter (Signed)
Medical Group Practice  [place], [place] [address]     PATIENT NAME: Deanna Parks  MRN: [id number]  DOB: [birth year]     3.29.2024  15:44    Please see nurse visit. Patient's blood pressure is actually less than Q000111Q systolic. Please advise on Losartan dose.     Clayton Lefort, BSN RN  Clinical Nurse Coordinator

## 2023-02-08 ENCOUNTER — Encounter (HOSPITAL_BASED_OUTPATIENT_CLINIC_OR_DEPARTMENT_OTHER): Payer: Self-pay | Admitting: Internal Medicine

## 2023-02-08 NOTE — Telephone Encounter (Signed)
Medical Group Practice  [place], [place] [address]       PATIENT NAME: Deanna Parks  MRN: [id number]  DOB: 09/27/1961        Date: 02/08/2023  Time: 08:45      Mehaffey, Apolonio Schneiders, MD  P Mgm Utc Cnc Clinical Support  Please have pt increase Losartan to 100 mg       Ivette Loyal, BSN RN  Clinical Nurse Coordinator

## 2023-02-15 ENCOUNTER — Telehealth (HOSPITAL_BASED_OUTPATIENT_CLINIC_OR_DEPARTMENT_OTHER): Payer: Self-pay | Admitting: Internal Medicine

## 2023-02-15 NOTE — Telephone Encounter (Signed)
Medical Group Practice  [address]  [place], [place] [address]       PATIENT NAME: Deanna Parks  MRN: [id number]  DOB: May 26, 1961        Date: 02/15/2023  Time: 15:26      Mehaffey, Fleet Contras, MD  P Mgm Utc Cnc Clinical Support  Please schedule follow up with me - BP's still elevated, will need to discuss additional meds.       Lynda Rainwater, BSN RN  Clinical Nurse Coordinator

## 2023-02-16 ENCOUNTER — Other Ambulatory Visit: Payer: Self-pay

## 2023-02-16 ENCOUNTER — Ambulatory Visit (INDEPENDENT_AMBULATORY_CARE_PROVIDER_SITE_OTHER): Payer: Self-pay | Admitting: Audiologist

## 2023-02-16 DIAGNOSIS — H903 Sensorineural hearing loss, bilateral: Secondary | ICD-10-CM

## 2023-02-16 NOTE — Telephone Encounter (Signed)
Medical Group Practice  [address]  [place], [place] [address]       PATIENT NAME: Deanna Parks  MRN: [id number]  DOB: [birth year]        Date: 02/16/2023  Time: 16:05      Mehaffey, Fleet Contras, MD  You12 minutes ago (3:53 PM)     Ok- please add her at 2 pm as a video appt  Thanks!     You  Mehaffey, Fleet Contras, MD1 hour ago (2:52 PM)     I called and spoke with her. She is only available tomorrow between 12:30 and 4. She has a meeting in the morning that she can't reschedule. She is willing to do a MyChart video visit tomorrow afternoon if you want to add her on.    Thanks,  Mickie Hillier, Fleet Contras, MD  You6 hours ago (9:34 AM)     Yes -can she do 10:30?  Can be virtual or in person depending on her preference.     You  Mehaffey, Fleet Contras, MDYesterday (3:27 PM)     Would it be okay for me to schedule her while you're here Thursday morning, or would you like her scheduled during resident clinic next Tuesday morning?    Thank you,  Jerald Kief, BSN RN  Clinical Nurse Coordinator

## 2023-02-16 NOTE — Progress Notes (Signed)
AUDIOLOGY, APPLIED HUMAN SCIENCES BUILDING  [address]  [place] [address]    Progress Note    Name: Deanna Parks MRN:  [id number]   Date: 02/16/2023 Age: 62 y.o.       Subjective: Deanna Parks was seen for a hearing aid fitting  Problem   Wears Hearing Aid in Both Ears    Hearing Aids:                Make: Oticon                Model: Intent 3 miniRITE R                Domes: 82mm open base                Wax Filter: Prowax mini fit                Receiver (Length/Power): size 2 60 gain                Battery Size: Rechargeable     Serial Numbers:         Right: [id number]         Left: [id number]     Trial Period Expires: 05/17/2023  Warranty Period Expires: 03/03/2026          Objective:  Evaluation and programming of the hearing aid(s):  SPL Output (REAR) measures indicate:  Both hearing aids were within +/- 5 dB of prescriptive targets from 250 Hz to 4000 Hz for soft (50 dB SPL), medium (65 dB SPL), and high (75 dB SPL) input levels. Prescriptive targets utilized: NAL-NL2  The maximum possible output of the hearing aid(s) fell below predicted uncomfortable listening levels as indicated by a 85 dB swept pure tone input signal.  The patient was pleased with the overall sound quality of the hearing aids at these prescriptive settings.    Hearing Aid features activated:  Pharmacologist- paired with iPhone    Programming was set for: NAL-NL2    Patient education:    Patient is an new hearing aid user with no special learning needs and was counseled on the importance of daily hearing aid use.  Hearing aid orientation was provided and he/she was able to demonstrate or explain the following after instruction:  Change/ Charge hearing aid battery.  Use of hearing aid cleaning tools.  How to turn hearing aid on & off.  How to insert & remove the hearing aid(s) from ear.  How to recognize when the hearing aid(s) is/are not operating properly and/or what to do if hearing aid repair is required.  The 90 day trial period  was discussed and no changes to the model of a hearing aid can be made after this point of time.    Assessment:  Hearing aid parameters were set based on patient's current hearing loss and daily listening conditions. Subjectively she was pleased with the sound quality.     Plan:  The patient will call as necessary to schedule follow-up appointments. He/she understands that it is his/her responsibility to contact the Audiology Clinic if he/she experiences any difficulty with the hearing aid(s).  Payment was accepted during today's appointment, receipt saved in [place].    A 2 week follow up appointment will be scheduled, she reported she would call to schedule this.     Care was observed today by students Eugenie Filler, Princella Pellegrini and Barbara Cower as supervised by Dr. Vladimir Crofts.  Tereasa Coop, CCC-A

## 2023-02-17 ENCOUNTER — Ambulatory Visit: Payer: 59 | Attending: Internal Medicine | Admitting: Internal Medicine

## 2023-02-17 ENCOUNTER — Encounter (HOSPITAL_BASED_OUTPATIENT_CLINIC_OR_DEPARTMENT_OTHER): Payer: Self-pay | Admitting: Internal Medicine

## 2023-02-17 DIAGNOSIS — I10 Essential (primary) hypertension: Secondary | ICD-10-CM

## 2023-02-17 MED ORDER — HYDROCHLOROTHIAZIDE 25 MG TABLET
25.0000 mg | ORAL_TABLET | Freq: Every day | ORAL | 1 refills | Status: DC
Start: 2023-02-17 — End: 2023-04-15

## 2023-02-17 MED ORDER — LOSARTAN 100 MG TABLET
100.0000 mg | ORAL_TABLET | Freq: Every day | ORAL | 3 refills | Status: DC
Start: 2023-02-17 — End: 2023-04-15

## 2023-02-17 NOTE — Progress Notes (Signed)
INTERNAL MEDICINE, [facility]  [address]  [place] [address]  Operated by [facility]  Video Visit     Name: Deanna Parks  MRN: [id number]    Date: 02/17/2023  Age: 62 y.o.                            Patient's location: [facility] [place] [address]   Patient/family aware of provider location: Yes  Patient/family consent for video visit: Yes  Interview and observation performed by: [name], MD    Chief Complaint: Hypertension    History of Present Illness:  Deanna Parks is a 62 y.o. female.  HTN - started on Losartan, increased to 100 mg. Still noting BP's above goal.     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    Medications:    buPROPion    diclofenac sodium    docosahexaenoic acid/epa (FISH OIL ORAL)    escitalopram oxalate    hydroCHLOROthiazide    ketoconazole    losartan    propranoloL    Tretinoin     Assessment/Plan:  Discussed HTN despite maximum dose Losartan. Discussed options including diuretics vs CCB's.  Opted for HCTZ 25 mg daily.  [name] will continue checking daily BP's.    BMP next week after starting HCTZ.       ICD-10-CM    1. Essential hypertension  I10 Basic Metabolic Panel Non fasting        Orders Placed This Encounter    Basic Metabolic Panel Non fasting    hydroCHLOROthiazide (HYDRODIURIL) 25 mg Oral Tablet    losartan (COZAAR) 100 mg Oral Tablet     [name], MD

## 2023-03-01 ENCOUNTER — Ambulatory Visit (HOSPITAL_BASED_OUTPATIENT_CLINIC_OR_DEPARTMENT_OTHER): Payer: Self-pay | Admitting: Internal Medicine

## 2023-03-01 NOTE — Telephone Encounter (Signed)
Patient-Entered Data   FW: Abnormal results from patient entered flowsheet  Due: Tomorrow Received: Coralyn Helling, Fleet Contras, MD  P Mgm Utc Cnc Clinical Support  Please check on pt's symptoms with these elevated BP's          Previous Messages       ----- Message -----  From: SYSTEM  Sent: 02/26/2023   8:11 AM EDT  To: [name], MD  Subject: Abnormal results from patient entered flowshTresa Parks has submitted abnormal  Home Blood Pressure Flowsheet data.  Time data was submitted: 02/26/2023  8:11 AM     Time Taken Diastolic Systolic Current Pulse BP Cuff Type   02/26/2023  8:09 AM 102  185  58 Automatic     View all of Deanna Parks's readings.

## 2023-03-07 ENCOUNTER — Ambulatory Visit (HOSPITAL_BASED_OUTPATIENT_CLINIC_OR_DEPARTMENT_OTHER): Payer: Self-pay | Admitting: Internal Medicine

## 2023-03-07 NOTE — Telephone Encounter (Signed)
FW: New results from patient entered flowsheet  Due: In 5 months Received: Today  Mehaffey, Fleet Contras, MD  P Mgm Utc Cnc Clinical Support  Virtual visit please within 2 weeks to discuss HTN  Can be at POC in person tomorrow/next Tuesday if pt prefers as well.               Medical Group Practice  [address]  [place], [place] [address]     PATIENT NAME: Deanna Parks  MRN: [id number]  DOB: February 17, 1961     4.29.2024  09:58    I tried to contact the patient to schedule an appointment while Dr. Lisabeth Pick is staffing at the POC. She didn't answer my call. Voicemail with callback number left. Will send a MyChart message, too.    M[facility] POC :  Tuesday, April 30th :  - [place]  - Gala Romney  - Saif[place]    Tuesday, May 7th :  Crista Luria  - [place]  - Louretta Parma, BSN RN  Clinical Nurse Coordinator

## 2023-03-08 ENCOUNTER — Ambulatory Visit (INDEPENDENT_AMBULATORY_CARE_PROVIDER_SITE_OTHER): Payer: Self-pay | Admitting: Student in an Organized Health Care Education/Training Program

## 2023-03-15 ENCOUNTER — Ambulatory Visit: Payer: 59 | Attending: PHYSICIAN ASSISTANT | Admitting: PHYSICIAN ASSISTANT

## 2023-03-15 ENCOUNTER — Ambulatory Visit (INDEPENDENT_AMBULATORY_CARE_PROVIDER_SITE_OTHER): Payer: 59

## 2023-03-15 ENCOUNTER — Encounter (HOSPITAL_BASED_OUTPATIENT_CLINIC_OR_DEPARTMENT_OTHER): Payer: Self-pay | Admitting: PHYSICIAN ASSISTANT

## 2023-03-15 ENCOUNTER — Other Ambulatory Visit: Payer: Self-pay

## 2023-03-15 ENCOUNTER — Other Ambulatory Visit (HOSPITAL_BASED_OUTPATIENT_CLINIC_OR_DEPARTMENT_OTHER): Payer: 59

## 2023-03-15 ENCOUNTER — Encounter (INDEPENDENT_AMBULATORY_CARE_PROVIDER_SITE_OTHER): Payer: Self-pay

## 2023-03-15 VITALS — BP 112/74 | HR 73 | Temp 96.4°F | Ht 65.63 in | Wt 200.0 lb

## 2023-03-15 DIAGNOSIS — I10 Essential (primary) hypertension: Secondary | ICD-10-CM

## 2023-03-15 LAB — BASIC METABOLIC PANEL
ANION GAP: 10 mmol/L (ref 4–13)
BUN/CREA RATIO: 18 (ref 6–22)
BUN: 13 mg/dL (ref 8–25)
CALCIUM: 9.4 mg/dL (ref 8.6–10.3)
CHLORIDE: 99 mmol/L (ref 96–111)
CO2 TOTAL: 26 mmol/L (ref 23–31)
CREATININE: 0.74 mg/dL (ref 0.60–1.05)
ESTIMATED GFR - FEMALE: >90 mL/min/BSA (ref >=60)
GLUCOSE: 152 mg/dL — ABNORMAL HIGH (ref 65–125)
POTASSIUM: 3.7 mmol/L (ref 3.5–5.1)
SODIUM: 135 mmol/L — ABNORMAL LOW (ref 136–145)

## 2023-03-15 NOTE — Progress Notes (Signed)
INTERNAL MEDICINE   Medical Group Practice at [facility]  Acute Visit Progress Note     Name: Deanna Parks Date of Service: 03/15/2023   MRN:  [id number]  Age/DOB: 62 y.o., 1960/11/24 PCP:  [name], MD  Reason for Visit: Blood Pressure Check     Subjective:      Deanna Parks is a 62 y.o. female with PMH of HTN, GAD, osteopenia who presents to the clinic for BP check.    Patient following with PCP for HTN. Had previously been started on losartan which was titrated up to 100 mg. Was started on HCTZ 25 mg last month. BP has been 140-185 systolic. Denies any symptoms.  States her cuff is brand new and is reading the same as her old one. Took BP yesterday which was 145 systolic. Diastolics can be 80-100.  The addition of HCTZ has brough her home readings down. States BP cuff at home is really tight when she puts it on. BP stable today in clinic at 112/74 and when checked again was 116/72. Denies any issues with chest pain, SOB, dizziness, blurred vision, leg swelling.     Medical History:      Past Medical History, Allergies, Surgical History, Family, and Social History were reviewed  on   03/15/2023 and updated as needed.     MEDICATIONS:  Outpatient Medications Marked as Taking for the 03/15/23 encounter (Office Visit) with Lenis Noon, PA-C   Medication Sig    buPROPion (WELLBUTRIN XL) 150 mg extended release 24 hr tablet Take 1 Tablet (150 mg total) by mouth Once a day    docosahexaenoic acid/epa (FISH OIL ORAL) Take by mouth    escitalopram oxalate (LEXAPRO) 20 mg Oral Tablet Take 1 Tablet (20 mg total) by mouth Once a day    hydroCHLOROthiazide (HYDRODIURIL) 25 mg Oral Tablet Take 1 Tablet (25 mg total) by mouth Once a day    losartan (COZAAR) 100 mg Oral Tablet Take 1 Tablet (100 mg total) by mouth Once a day    Tretinoin (RETIN-A) 0.025 % Cream Apply topically Every night         Physical Exam:     Vitals:   height is 1.667 m (5' 5.63") and weight is 90.7 kg (199 lb 15.3 oz). Her temporal temperature is 35.8 C  (96.4 F) (abnormal). Her blood pressure is 112/74 and her pulse is 73. Her oxygen saturation is 96%.      General:  Well-nourished, well-developed, in no apparent distress  Head:  Normocephalic, atraumatic  Ears:  Tympanic membranes pearly without erythema or edema bilaterally  Eyes:  Pupils equally round and reactive to light, extraocular muscles intact, sclareae non-icteric, conjunctivae clear  Nose:  Turbinates without inflammation or erythema  Throat:  Oropharynx clear, mucous membranes moist  Neck:  Trachea midline, no thyromegaly or lymphadenopathy  Cardiovascular:  Regular rate and rhythm, no murmurs, rubs, gallops  Respiratory:  Clear to auscultation bilaterally, no wheezes, rhonchi, crackles  Abdominal:  Bowel sounds normal; abdomen soft, non-tender to palpation  Extremities:  No edema, dorsalis pedis pulses 2+ bilaterally  Skin:  Warm and dry  Neurological:  Awake, A&O x 3. CNII-XII, strength and sensation grossly intact  Psychiatric:  Normal mood & affect, thought process linear, speech content normal       Data Reviewed and Interpretation:     BASIC METABOLIC PANEL  Lab Results   Component Value Date    SODIUM 142 02/04/2023    POTASSIUM 3.5 02/04/2023  CHLORIDE 106 02/04/2023    CO2 29 02/04/2023    ANIONGAP 7 02/04/2023    BUN 14 02/04/2023    CREATININE 0.74 02/04/2023    BUNCRRATIO 19 02/04/2023    GFR >90 02/04/2023    CALCIUM 9.7 02/04/2023             Assessment:      Deanna Parks is a 62 y.o. female withPMH of HTN, GAD, osteopenia who presents to the clinic for BP check.         Plan:     (I10) Essential hypertension  (primary encounter diagnosis)  Plan:   -Continue losartan 100 mg and HCTZ 25 mg.  -BMP done today.  -Advised that her cuff may be inaccurate/cuff size may be wrong. Advised she can bring ehr cuff in to be checked and she declines at this time.   -She is agreeable to a 1 month in person follow up.       Follow Up:       Return to clinic in 1 month.    Patient had no questions  regarding treatment plan, goals, risks or benefits and agrees to contact me or my clinic in the interim should any questions or problems arise.    Lenis Noon, PA-C  Internal Medicine, [facility]  [address]   [place], [place], [address]  Phone: [phone]  Fax: [phone]

## 2023-04-15 ENCOUNTER — Encounter (INDEPENDENT_AMBULATORY_CARE_PROVIDER_SITE_OTHER): Payer: Self-pay | Admitting: INTERNAL MEDICINE

## 2023-04-15 ENCOUNTER — Ambulatory Visit: Payer: 59 | Attending: INTERNAL MEDICINE | Admitting: INTERNAL MEDICINE

## 2023-04-15 ENCOUNTER — Other Ambulatory Visit (INDEPENDENT_AMBULATORY_CARE_PROVIDER_SITE_OTHER): Payer: 59 | Admitting: Rheumatology

## 2023-04-15 ENCOUNTER — Other Ambulatory Visit: Payer: Self-pay

## 2023-04-15 VITALS — BP 138/80 | HR 67 | Temp 96.9°F | Ht 65.63 in | Wt 200.6 lb

## 2023-04-15 DIAGNOSIS — I10 Essential (primary) hypertension: Secondary | ICD-10-CM | POA: Insufficient documentation

## 2023-04-15 LAB — THYROID STIMULATING HORMONE WITH FREE T4 REFLEX: TSH: 2.07 u[IU]/mL (ref 0.350–4.940)

## 2023-04-15 MED ORDER — BUPROPION HCL XL 150 MG 24 HR TABLET, EXTENDED RELEASE
150.0000 mg | ORAL_TABLET | Freq: Every day | ORAL | 3 refills | Status: DC
Start: 2023-04-15 — End: 2023-12-01

## 2023-04-15 MED ORDER — ESCITALOPRAM 20 MG TABLET
20.0000 mg | ORAL_TABLET | Freq: Every day | ORAL | 3 refills | Status: DC
Start: 2023-04-15 — End: 2024-06-27

## 2023-04-15 MED ORDER — HYDROCHLOROTHIAZIDE 25 MG TABLET
25.0000 mg | ORAL_TABLET | Freq: Every day | ORAL | 1 refills | Status: DC
Start: 2023-04-15 — End: 2023-12-28

## 2023-04-15 MED ORDER — LOSARTAN 100 MG TABLET
100.0000 mg | ORAL_TABLET | Freq: Every day | ORAL | 3 refills | Status: DC
Start: 2023-04-15 — End: 2024-06-27

## 2023-04-15 NOTE — Progress Notes (Signed)
INTERNAL MEDICINE   Medical Group Practice at [facility]  Acute Visit Progress Note     Name: Deanna Parks Date of Service: 04/15/2023   MRN:  [id number]  Age/DOB: 62 y.o., [birth year] PCP:  [name], MD  Reason for Visit: Follow Up     Subjective:      Deanna Parks is a 62 y.o. female with PMH of HTN, GAD, osteopenia who presents to the clinic for follow up BP check.    Patient  was recently diagnosed hypertensive 01/24 after recording elevated BP levels at home.  States she has been religious with her home BP meds of losartan 100 mg daily and HCT 25  mg  but noticed her home pressure reading were much 50 mmHg higher than clinic reading. This was attributed to use BP cuff at home that is really tight when she puts it on. She has since stopped checking her BP at hime and she was advised to return today for BP check..  Denies any symptoms.   She reports improved BP since HCTZ was added. BP stable today in clinic at 138/80. Denies any issues with chest pain,  back pain, SOB, dizziness, blurred vision, leg swelling, or visual symptoms. States she has been following a healthy diet  including low salt and moderate carbohydrate.         Medical History:      Past Medical History, Allergies, Surgical History, Family, and Social History were reviewed  on   04/15/2023 and updated as needed.     MEDICATIONS:  Outpatient Medications Marked as Taking for the 04/15/23 encounter (Office Visit) with Durene Romans, MD   Medication Sig    buPROPion (WELLBUTRIN XL) 150 mg extended release 24 hr tablet Take 1 Tablet (150 mg total) by mouth Once a day    diclofenac sodium (VOLTAREN) 1 % Gel Apply 2 g topically Four times a day    docosahexaenoic acid/epa (FISH OIL ORAL) Take by mouth    escitalopram oxalate (LEXAPRO) 20 mg Oral Tablet Take 1 Tablet (20 mg total) by mouth Once a day    hydroCHLOROthiazide (HYDRODIURIL) 25 mg Oral Tablet Take 1 Tablet (25 mg total) by mouth Once a day    losartan (COZAAR) 100 mg Oral Tablet Take 1 Tablet  (100 mg total) by mouth Once a day         Physical Exam:     Vitals:   height is 1.667 m (5' 5.63") and weight is 91 kg (200 lb 9.9 oz). Her temperature is 36.1 C (96.9 F). Her blood pressure is 138/80 and her pulse is 67. Her oxygen saturation is 95%.      General:  Well-nourished, well-developed, in no apparent distress  Head:  Normocephalic, atraumatic  Ears:  Tympanic membranes pearly without erythema or edema bilaterally  Eyes:  Pupils equally round and reactive to light, extraocular muscles intact, sclareae non-icteric, conjunctivae clear  Nose:  Turbinates without inflammation or erythema  Throat:  Oropharynx clear, mucous membranes moist  Neck:  Trachea midline, no thyromegaly or lymphadenopathy  Cardiovascular:  Regular rate and rhythm, no murmurs, rubs, gallops  Respiratory:  Clear to auscultation bilaterally, no wheezes, rhonchi, crackles  Abdominal:  Bowel sounds normal; abdomen soft, non-tender to palpation  Extremities:  No edema, dorsalis pedis pulses 2+ bilaterally  Skin:  Warm and dry  Neurological:  Awake, A&O x 3. CNII-XII, strength and sensation grossly intact  Psychiatric:  Normal mood & affect, thought process linear, speech content normal  Data Reviewed and Interpretation:     BASIC METABOLIC PANEL  Lab Results   Component Value Date    SODIUM 135 (L) 03/15/2023    POTASSIUM 3.7 03/15/2023    CHLORIDE 99 03/15/2023    CO2 26 03/15/2023    ANIONGAP 10 03/15/2023    BUN 13 03/15/2023    CREATININE 0.74 03/15/2023    BUNCRRATIO 18 03/15/2023    GFR >90 03/15/2023    CALCIUM 9.4 03/15/2023             Assessment:      Deanna Parks is a 62 y.o. female withPMH of HTN, GAD, osteopenia who presents to the clinic for BP check.         Plan:     (I10) Essential hypertension  (primary encounter diagnosis)  Plan:   - Continue losartan 100 mg and HCTZ 25 mg.  - Counseled about  DASH diet that focuses on vegetables, fruits, whole grains, low-fat dairy products.  - Counseled patient on getting an  appropriate cuff  size for BP checks.  - Provided patient a printout showing appropriate to check BP at home.   - Ordering TSH today   - She is agreeable to a 3 month in person follow up.      Healthcare Gaps  - Patient reports completing all proposed care gaps.  - Signed release of medical records form her [facility] in [place].        Follow Up:       Return to clinic in 3 month.    Patient had no questions regarding treatment plan, goals, risks or benefits and agrees to contact me or my clinic in the interim should any questions or problems arise.    Durene Romans, MD  Internal Medicine, PGY-1                        I discussed the patient's care with the Resident prior to the patient leaving the clinic. Any significant discussion points are noted .    Vivi Ferns, MD

## 2023-05-10 ENCOUNTER — Telehealth (INDEPENDENT_AMBULATORY_CARE_PROVIDER_SITE_OTHER): Payer: Self-pay | Admitting: Audiologist

## 2023-10-17 ENCOUNTER — Ambulatory Visit: Payer: 59 | Attending: Internal Medicine

## 2023-10-17 DIAGNOSIS — R052 Subacute cough: Secondary | ICD-10-CM

## 2023-10-17 MED ORDER — CODEINE 10 MG-GUAIFENESIN 100 MG/5 ML ORAL LIQUID
5.0000 mL | Freq: Every evening | ORAL | 0 refills | Status: DC
Start: 2023-10-17 — End: 2023-12-28

## 2023-10-17 NOTE — Progress Notes (Addendum)
Reviewed questions listed under E visit.   Cough - no fevers, no chills no SOB.   Ongoing for 2 weeks.   No sore throat.  Just exhausted from coughing.   Just cannot get rid of the cough.   No fevers, no SOB.    COVID Negative.     Discussed by phone - has tolerated Codeine cough syrup nightly to help her rest previously. Discussed risks with this.  Discussed that this is a controlled substance (not to mix with alcohol or other sedating meds).      She will message me later this week with an update. If any symptoms change, she will come in for an appt and lung exam.     1. Subacute cough  - codeine-guaiFENesin (GUAIFENESIN AC) 10-100 mg/5 mL Oral Liquid; Take 5 mL by mouth Every night  Dispense: 118 mL; Refill: 0      Total time 6 minutes

## 2023-10-17 NOTE — E-Visit Routing Comment (Signed)
E-visit received and forwarded to PCP and  associated clinical pool (if applicable) per patient request.   Irine Seal, PATIENT NAVIGATOR,10/17/2023,14:27

## 2023-10-26 ENCOUNTER — Encounter (HOSPITAL_BASED_OUTPATIENT_CLINIC_OR_DEPARTMENT_OTHER): Payer: Self-pay | Admitting: Internal Medicine

## 2023-11-11 ENCOUNTER — Other Ambulatory Visit: Payer: Self-pay

## 2023-11-30 ENCOUNTER — Encounter (HOSPITAL_BASED_OUTPATIENT_CLINIC_OR_DEPARTMENT_OTHER): Payer: Self-pay | Admitting: Internal Medicine

## 2023-12-01 MED ORDER — BUPROPION HCL XL 300 MG 24 HR TABLET, EXTENDED RELEASE
300.0000 mg | ORAL_TABLET | Freq: Every day | ORAL | 3 refills | Status: DC
Start: 2023-12-01 — End: 2024-06-27

## 2023-12-10 ENCOUNTER — Other Ambulatory Visit (INDEPENDENT_AMBULATORY_CARE_PROVIDER_SITE_OTHER): Payer: Self-pay

## 2023-12-28 ENCOUNTER — Encounter (HOSPITAL_BASED_OUTPATIENT_CLINIC_OR_DEPARTMENT_OTHER): Payer: Self-pay | Admitting: Internal Medicine

## 2023-12-28 ENCOUNTER — Other Ambulatory Visit: Payer: Self-pay

## 2023-12-28 ENCOUNTER — Telehealth (HOSPITAL_BASED_OUTPATIENT_CLINIC_OR_DEPARTMENT_OTHER): Payer: Self-pay | Admitting: Internal Medicine

## 2023-12-28 ENCOUNTER — Ambulatory Visit: Payer: 59 | Attending: Internal Medicine | Admitting: Internal Medicine

## 2023-12-28 VITALS — BP 112/80 | HR 75 | Temp 97.7°F | Ht 65.71 in | Wt 174.4 lb

## 2023-12-28 DIAGNOSIS — F32A Depression, unspecified: Secondary | ICD-10-CM

## 2023-12-28 DIAGNOSIS — R42 Dizziness and giddiness: Secondary | ICD-10-CM | POA: Insufficient documentation

## 2023-12-28 DIAGNOSIS — I10 Essential (primary) hypertension: Secondary | ICD-10-CM | POA: Insufficient documentation

## 2023-12-28 DIAGNOSIS — F325 Major depressive disorder, single episode, in full remission: Secondary | ICD-10-CM | POA: Insufficient documentation

## 2023-12-28 DIAGNOSIS — H8111 Benign paroxysmal vertigo, right ear: Secondary | ICD-10-CM | POA: Insufficient documentation

## 2023-12-28 MED ORDER — HYDROCHLOROTHIAZIDE 25 MG TABLET
12.5000 mg | ORAL_TABLET | Freq: Every day | ORAL | Status: DC
Start: 2023-12-28 — End: 2024-03-06

## 2023-12-28 NOTE — Nursing Note (Signed)
12/28/23 1000   Orthostatic Vitals Set #1   Time 1056   Initials AR   Patient Position Supine   Heart Rate 63   Blood Pressure 142/80   BP Source Left Arm   O2 SAT 92   Orthostatic Vitals Set #2   Time 1100   Initials AR   Patient Position Sitting   Heart Rate 67   Blood Pressure 128/90   BP Source  Left Arm   O2 SAT 95   Orthostatic Vitals Set #3   Time 1102   Initials AR   Patient Position Standing   Heart Rate 85   Blood Pressure 120/90   BP Source  Left Arm   O2 SAT 96     Flora Lipps, Ambulatory Care Assistant

## 2023-12-28 NOTE — Telephone Encounter (Signed)
Called patient per provider reqeust to see if they could come into their appointment earlier today 12/28/2023.     Patient answered and verified name and DOB. Patient agreeable to coming early. Patient had no other questions or concerns.     Flora Lipps, Ambulatory Care Assistant

## 2023-12-28 NOTE — Progress Notes (Signed)
INTERNAL MEDICINE, [facility]  [address]  [place] [address]  Operated by [facility]  Progress Note    Name: Deanna Parks MRN:  [id number]   Date: 12/28/2023 DOB:  Mar 15, 1961 (63 y.o.)     Chief Complaint: Vertigo  Subjective   Subjective:   Started last Monday.  Mostly with head movements.   Spinning, slightly nauseated.   Resolves quickly.   No numbness, no tingling. No trouble speaking.   Slight post-migraine type feeling   Has lost about 25 pounds since last OV. GI illness after traveling at Thanksgiving.  Has been drinking less alcohol, overall appetite is decreased.   Exercising by walking multiple times weekly.  Also doing yoga.   Cough has resolved.   Tapered off Bupropion for a few weeks. Felt increasing depression symptoms, re-started 150 mg daily and recently increased to 300 mg XL.  Depression symptoms resolved.      Objective   Objective :  BP 112/80 (Site: Left Arm, Patient Position: Sitting)   Pulse 75   Temp 36.5 C (97.7 F) (Thermal Scan)   Ht 1.669 m (5' 5.71")   Wt 79.1 kg (174 lb 6.1 oz)   SpO2 96%   BMI 28.40 kg/m   Positive dix-Hallpike maneuver, nystagmus present  CV - normal rate, regular rhythm  CTAB, no wheezes     Data reviewed:  Current Outpatient Medications   Medication Sig    buPROPion (WELLBUTRIN XL) 300 mg extended release 24 hr tablet Take 1 Tablet (300 mg total) by mouth Once a day    calcium carbonate/vitamin D3 (VITAMIN D-3 ORAL) Take by mouth    docosahexaenoic acid/epa (FISH OIL ORAL) Take by mouth    escitalopram oxalate (LEXAPRO) 20 mg Oral Tablet Take 1 Tablet (20 mg total) by mouth Once a day    hydroCHLOROthiazide (HYDRODIURIL) 25 mg Oral Tablet Take 1 Tablet (25 mg total) by mouth Once a day    losartan (COZAAR) 100 mg Oral Tablet Take 1 Tablet (100 mg total) by mouth Once a day    Tretinoin (RETIN-A) 0.025 % Cream Apply topically Every night (Patient not taking: Reported on 04/15/2023)        Assessment/Plan    1. Benign  paroxysmal positional vertigo of right ear (Primary) symptoms consistent with BPPV;  Exam positive, Dix Hallpike;  Discussed and encouraged Epley maneuvers - if ineffective, will place referral to vertigo PT.    2. Lightheaded    3. Essential hypertension Deanna Parks has lost 25 pounds since our last visit.  Concerned we may also be over-treating HTN with current meds (orthostatics positive).  Continue Losartan 100 mg daily, decreasing HCTZ to 12.5 mg daily.  Deanna Parks will check BP's at home over the next week, may need to stop HCTZ completely.     Depression - much better controlled since re-starting Wellbutrin, currently at 300 mg XL dose + Lexapro 20 mg daily;     Deanna Half, MD

## 2024-01-02 ENCOUNTER — Encounter (HOSPITAL_BASED_OUTPATIENT_CLINIC_OR_DEPARTMENT_OTHER): Payer: Self-pay | Admitting: Internal Medicine

## 2024-01-06 NOTE — Telephone Encounter (Signed)
 Medical Group Practice  [address]  [place], [place] [address]       PATIENT NAME: Deanna Parks  MRN: [id number]  DOB: May 05, 1961        Date: 01/06/2024  Time: 14:35      MyChart message has not been read. Attempted to call the patient. No answer. Left voice message with callback number.      Lynda Rainwater, BSN RN  Clinical Nurse Coordinator

## 2024-01-23 ENCOUNTER — Encounter (HOSPITAL_BASED_OUTPATIENT_CLINIC_OR_DEPARTMENT_OTHER): Payer: Self-pay | Admitting: Internal Medicine

## 2024-01-23 NOTE — Nursing Note (Signed)
 Medical Group Practice  [address]  [place], [place] [address]       PATIENT NAME: Deanna Parks  MRN: [id number]  DOB: [birth year]        Date: 01/23/2024  Time: 14:34      Faxed ROI to [facility] for patient's colonoscopy report from 2017. Confirmation received.    [facility]   [address]   [place] , [place] [address]   Phone: [phone]   Fax: [phone]     Lynda Rainwater, BSN RN  Clinical Nurse Coordinator

## 2024-01-24 NOTE — Nursing Note (Signed)
 Received fax from [facility] stating they do not have records for this patient. Placing to be scanned. Ronnald Ramp, MA

## 2024-03-05 ENCOUNTER — Other Ambulatory Visit (HOSPITAL_BASED_OUTPATIENT_CLINIC_OR_DEPARTMENT_OTHER): Payer: Self-pay | Admitting: Internal Medicine

## 2024-03-05 NOTE — Telephone Encounter (Signed)
 Last scheduled appointment with you was 12/28/2023.  Currently scheduled future appointment is Visit date not found.      Edmundo Gourd, RN  03/05/2024, 17:21

## 2024-06-06 ENCOUNTER — Other Ambulatory Visit (INDEPENDENT_AMBULATORY_CARE_PROVIDER_SITE_OTHER): Payer: Self-pay | Admitting: INTERNAL MEDICINE

## 2024-06-08 ENCOUNTER — Other Ambulatory Visit (INDEPENDENT_AMBULATORY_CARE_PROVIDER_SITE_OTHER): Payer: Self-pay

## 2024-06-19 ENCOUNTER — Other Ambulatory Visit (INDEPENDENT_AMBULATORY_CARE_PROVIDER_SITE_OTHER): Payer: Self-pay | Admitting: INTERNAL MEDICINE

## 2024-06-19 NOTE — Telephone Encounter (Signed)
 Duplicate refill

## 2024-06-27 ENCOUNTER — Encounter (HOSPITAL_BASED_OUTPATIENT_CLINIC_OR_DEPARTMENT_OTHER): Payer: Self-pay | Admitting: Internal Medicine

## 2024-06-27 ENCOUNTER — Ambulatory Visit: Attending: Internal Medicine | Admitting: Internal Medicine

## 2024-06-27 ENCOUNTER — Other Ambulatory Visit: Payer: Self-pay

## 2024-06-27 VITALS — BP 126/80 | HR 73 | Temp 96.2°F | Ht 65.87 in | Wt 167.5 lb

## 2024-06-27 DIAGNOSIS — Z1211 Encounter for screening for malignant neoplasm of colon: Secondary | ICD-10-CM | POA: Insufficient documentation

## 2024-06-27 DIAGNOSIS — Z1231 Encounter for screening mammogram for malignant neoplasm of breast: Secondary | ICD-10-CM | POA: Insufficient documentation

## 2024-06-27 DIAGNOSIS — Z23 Encounter for immunization: Secondary | ICD-10-CM | POA: Insufficient documentation

## 2024-06-27 DIAGNOSIS — I10 Essential (primary) hypertension: Secondary | ICD-10-CM | POA: Insufficient documentation

## 2024-06-27 DIAGNOSIS — Z0000 Encounter for general adult medical examination without abnormal findings: Secondary | ICD-10-CM | POA: Insufficient documentation

## 2024-06-27 DIAGNOSIS — F325 Major depressive disorder, single episode, in full remission: Secondary | ICD-10-CM | POA: Insufficient documentation

## 2024-06-27 MED ORDER — LOSARTAN 100 MG TABLET
100.0000 mg | ORAL_TABLET | Freq: Every day | ORAL | 3 refills | Status: AC
Start: 2024-06-27 — End: ?

## 2024-06-27 MED ORDER — ESCITALOPRAM 20 MG TABLET
20.0000 mg | ORAL_TABLET | Freq: Every day | ORAL | 3 refills | Status: AC
Start: 2024-06-27 — End: ?

## 2024-06-27 MED ORDER — BUPROPION HCL XL 300 MG 24 HR TABLET, EXTENDED RELEASE
300.0000 mg | ORAL_TABLET | Freq: Every day | ORAL | 3 refills | Status: AC
Start: 2024-06-27 — End: ?

## 2024-06-27 NOTE — Progress Notes (Signed)
 [facility]  INTERNAL MEDICINE, [place] [address]  Operated by [facility]    Annual Physical Exam  This note was created with assistance from [employer] via capture of conversational audio. Consent was obtained from the patient and all parties present prior to recording.     Deanna Parks  06/27/2024    Chief Complaint: Annual Physical Exam    Subjective:   History of Present Illness  Deanna Parks is a 63 year old female who presents for an annual physical exam.    She has experienced a significant weight loss of 35 pounds, attributed to dietary changes and increased physical activity. Her highest weight was recorded in June of the previous year, and her current weight is 167 pounds, slightly increased due to recent travel and dietary indulgences. She has been working with a Systems analyst to establish a routine that includes weight-bearing exercises, balance, and strength training. Additionally, she walks every other day, covering distances up to six miles, but usually around two miles.    She has a history of hypertension and is currently taking losartan  100 mg and hydrochlorothiazide  25 mg. She wants to manage her blood pressure without the diuretic, noting no significant urinary frequency with it. She plans to monitor her blood pressure at home, as it tends to be higher there.     She is also taking Lexapro  and Wellbutrin  for depression and anxiety, which are under good control. Additionally, she takes a vitamin D supplement and fish oil.    She mentions a history of altitude sickness during a visit to [place], [place] , which contributed to her recent weight loss. She has reduced her alcohol intake, which has helped with calorie reduction and improved sleep. No tobacco use.    She has a history of a normal colonoscopy at age 60 and is due for another screening. She has received both doses of the shingles vaccine and is up to date with her dental visits, reporting no cavities. Her eye appointment is  scheduled for next month, and she plans to have her hearing aids checked. She uses hearing aids primarily in crowded environments but removes them during live concerts due to frequency issues.    She is planning for retirement and considering relocating, with interests in areas like the [facility]. Her husband is retiring soon, and she is exploring options for her future residence. She is also looking into volunteering opportunities to stay active and engaged.    I reviewed and updated Deanna Parks's problem list.   I reviewed and updated Deanna Parks's past medical, past surgical, family and social history.  10 pt ROS completed and negative unless stated in HPI.     Current Outpatient Medications   Medication Sig    buPROPion  (WELLBUTRIN  XL) 300 mg extended release 24 hr tablet Take 1 Tablet (300 mg total) by mouth Once a day    calcium carbonate/vitamin D3 (VITAMIN D-3 ORAL) Take by mouth    docosahexaenoic acid/epa (FISH OIL ORAL) Take by mouth    escitalopram  oxalate (LEXAPRO ) 20 mg Oral Tablet Take 1 Tablet (20 mg total) by mouth Once a day    hydroCHLOROthiazide  (HYDRODIURIL ) 25 mg Oral Tablet TAKE 1 TABLET BY MOUTH DAILY    losartan  (COZAAR ) 100 mg Oral Tablet Take 1 Tablet (100 mg total) by mouth Once a day    Tretinoin  (RETIN-A ) 0.025 % Cream Apply topically Every night (Patient not taking: Reported on 04/15/2023)       Objective :  BP  126/80 (Site: Left Arm, Patient Position: Sitting, Cuff Size: Adult)   Pulse 73   Temp (!) 35.7 C (96.2 F) (Temporal)   Ht 1.673 m (5' 5.87)   Wt 76 kg (167 lb 8.8 oz)   SpO2 95%   BMI 27.15 kg/m   Physical Exam  Constitutional:       Appearance: Normal appearance.   HENT:      Head: Normocephalic and atraumatic.      Right Ear: Tympanic membrane normal.      Left Ear: Tympanic membrane normal.   Eyes:      Extraocular Movements: Extraocular movements intact.   Cardiovascular:      Rate and Rhythm: Normal rate and regular rhythm.      Heart sounds: No murmur  heard.  Pulmonary:      Effort: Pulmonary effort is normal.      Breath sounds: Normal breath sounds.   Abdominal:      General: Bowel sounds are normal.   Musculoskeletal:         General: No swelling. Normal range of motion.   Lymphadenopathy:      Cervical: No cervical adenopathy.   Neurological:      General: No focal deficit present.      Mental Status: Alert and oriented to person, place, and time.   Psychiatric:         Mood and Affect: Mood normal.     Assessment/Plan  Assessment & Plan  Adult Wellness Visit  Routine visit with well-controlled blood pressure and weight. No new complaints. Regular exercise and healthy diet contribute to weight loss and improved health.  - Perform blood work for kidney function, liver function, blood counts, and A1c.  - Administer pneumococcal vaccine and tetanus shot.  - Order Cologuard for colon cancer screening.  - Schedule Pap smear for next year.  - Order mammogram for February.  - Refill Lexapro , Wellbutrin , and losartan  prescriptions.  - Monitor blood pressure with home cuff and adjust hydrochlorothiazide  as needed.    Essential hypertension  Blood pressure well-controlled with current medication. Recent weight loss improved control.  - Taper hydrochlorothiazide  from 25 mg to 12.5 mg and monitor response.  - Continue losartan  100 mg daily.  - Monitor blood pressure at home and report changes.    Depression, stable on therapy  Depression well-managed with Wellbutrin  and Lexapro . No new symptoms.  - Continue Wellbutrin  300 mg daily.  - Continue Lexapro  20 mg daily.    Obesity, resolved with weight loss   Significant weight loss of 35 pounds achieved. Current weight 167 pounds, Deanna Parks's goal is 150 pounds. Maintaining healthy diet and exercise.  - Continue exercise regimen with personal trainer, including weight-bearing and balance exercises.  - Maintain healthy diet and avoid snacking.  - Monitor weight and adjust goals as needed.    Hearing loss, managed with hearing  aids  Hearing aids effective in crowded environments but not ideal for music.  - Scheduled hearing aid check at [facility] audiology clinic.    Vernell Galeazzi, MD

## 2024-06-29 ENCOUNTER — Other Ambulatory Visit: Payer: Self-pay

## 2024-06-29 ENCOUNTER — Ambulatory Visit: Attending: Internal Medicine

## 2024-06-29 DIAGNOSIS — Z0000 Encounter for general adult medical examination without abnormal findings: Secondary | ICD-10-CM | POA: Insufficient documentation

## 2024-06-29 LAB — CBC
HCT: 40.9 % (ref 34.8–46.0)
HGB: 13.7 g/dL (ref 11.5–16.0)
MCH: 29.8 pg (ref 26.0–32.0)
MCHC: 33.5 g/dL (ref 31.0–35.5)
MCV: 88.9 fL (ref 78.0–100.0)
MPV: 9.1 fL (ref 8.7–12.5)
PLATELETS: 340 x10ˆ3/uL (ref 150–400)
RBC: 4.6 x10ˆ6/uL (ref 3.85–5.22)
RDW-CV: 13.2 % (ref 11.5–15.5)
WBC: 7.1 x10ˆ3/uL (ref 3.7–11.0)

## 2024-06-29 LAB — COMPREHENSIVE METABOLIC PANEL, NON-FASTING
ALBUMIN: 3.9 g/dL (ref 3.4–4.8)
ALKALINE PHOSPHATASE: 63 U/L (ref 50–130)
ALT (SGPT): 14 U/L (ref <31)
ANION GAP: 8 mmol/L (ref 4–13)
AST (SGOT): 17 U/L (ref 11–34)
BILIRUBIN TOTAL: 0.6 mg/dL (ref 0.3–1.3)
BUN/CREA RATIO: 17 (ref 6–22)
BUN: 12 mg/dL (ref 8–25)
CALCIUM: 9.5 mg/dL (ref 8.6–10.3)
CHLORIDE: 93 mmol/L — ABNORMAL LOW (ref 96–111)
CO2 TOTAL: 30 mmol/L (ref 23–31)
CREATININE: 0.7 mg/dL (ref 0.60–1.05)
ESTIMATED GFR - FEMALE: >90 mL/min/BSA (ref >=60)
GLUCOSE: 97 mg/dL (ref 65–125)
POTASSIUM: 3.6 mmol/L — ABNORMAL LOW (ref 3.7–5.3)
PROTEIN TOTAL: 6.8 g/dL (ref 5.6–7.6)
SODIUM: 131 mmol/L — ABNORMAL LOW (ref 136–145)

## 2024-06-29 LAB — LIPID PANEL
CHOL/HDL RATIO: 2.6
CHOLESTEROL: 228 mg/dL — ABNORMAL HIGH (ref 100–200)
HDL CHOL: 87 mg/dL (ref >=50)
LDL CALC: 130 mg/dL — ABNORMAL HIGH (ref <100)
NON-HDL: 141 mg/dL (ref <=190)
TRIGLYCERIDES: 65 mg/dL (ref <150)
VLDL CALC: 11 mg/dL (ref <30)

## 2024-06-29 LAB — THYROID STIMULATING HORMONE WITH FREE T4 REFLEX: TSH: 1.899 u[IU]/mL (ref 0.350–4.940)

## 2024-07-02 ENCOUNTER — Ambulatory Visit (HOSPITAL_BASED_OUTPATIENT_CLINIC_OR_DEPARTMENT_OTHER): Admitting: Internal Medicine

## 2024-07-03 ENCOUNTER — Ambulatory Visit (INDEPENDENT_AMBULATORY_CARE_PROVIDER_SITE_OTHER): Payer: Self-pay | Admitting: Internal Medicine

## 2024-07-03 DIAGNOSIS — E876 Hypokalemia: Secondary | ICD-10-CM

## 2024-07-03 DIAGNOSIS — E871 Hypo-osmolality and hyponatremia: Secondary | ICD-10-CM

## 2024-07-06 LAB — COLOGUARD® COLON CANCER SCREEN: COLOGUARD RESULT: NEGATIVE

## 2024-08-15 ENCOUNTER — Encounter (HOSPITAL_BASED_OUTPATIENT_CLINIC_OR_DEPARTMENT_OTHER): Payer: Self-pay | Admitting: Internal Medicine

## 2024-08-23 NOTE — Telephone Encounter (Signed)
 Medical Group Practice  [address]  [place], [place] [address]       PATIENT NAME: Deanna Parks  MRN: [id number]  DOB: 02/23/1961        Date: 08/23/2024  Time: 14:07      Called and relayed this information to the patient. Patient verbalized understanding. She asked if she's due for repeat labs. She said she received a MyChart message about her sodium and potassium being low. Informed the patient it appears Dr. Augie wanted her to have labs repeated two weeks after stopping the HCTZ. Patient states she will have this drawn. Order in the system.      Sotero Balling, BSN RN  Clinical Nurse Coordinator

## 2024-09-09 ENCOUNTER — Other Ambulatory Visit: Payer: Self-pay

## 2024-09-10 ENCOUNTER — Ambulatory Visit
Admission: RE | Admit: 2024-09-10 | Discharge: 2024-09-10 | Disposition: A | Discharge status: Home / Self Care | Payer: Self-pay | Source: Ambulatory Visit | Attending: Internal Medicine | Admitting: Internal Medicine

## 2024-09-10 ENCOUNTER — Encounter (HOSPITAL_BASED_OUTPATIENT_CLINIC_OR_DEPARTMENT_OTHER): Payer: Self-pay

## 2024-09-10 DIAGNOSIS — Z1231 Encounter for screening mammogram for malignant neoplasm of breast: Secondary | ICD-10-CM | POA: Insufficient documentation

## 2024-09-12 DIAGNOSIS — Z1231 Encounter for screening mammogram for malignant neoplasm of breast: Secondary | ICD-10-CM

## 2024-11-30 ENCOUNTER — Ambulatory Visit: Attending: Internal Medicine

## 2024-11-30 ENCOUNTER — Other Ambulatory Visit: Payer: Self-pay

## 2024-11-30 DIAGNOSIS — E871 Hypo-osmolality and hyponatremia: Secondary | ICD-10-CM | POA: Insufficient documentation

## 2024-11-30 DIAGNOSIS — E876 Hypokalemia: Secondary | ICD-10-CM | POA: Insufficient documentation

## 2024-11-30 LAB — BASIC METABOLIC PANEL
BUN: 13 mg/dL (ref 8–25)
POTASSIUM: 4 mmol/L (ref 3.5–5.1)
eGFRcr - FEMALE: >90 mL/min/{1.73_m2} (ref >=60)

## 2024-12-03 ENCOUNTER — Ambulatory Visit (HOSPITAL_BASED_OUTPATIENT_CLINIC_OR_DEPARTMENT_OTHER): Payer: Self-pay | Admitting: Internal Medicine

## 2024-12-12 ENCOUNTER — Ambulatory Visit: Payer: Self-pay

## 2024-12-12 ENCOUNTER — Other Ambulatory Visit: Payer: Self-pay

## 2024-12-12 VITALS — BP 128/84

## 2024-12-12 DIAGNOSIS — Z0130 Encounter for examination of blood pressure without abnormal findings: Secondary | ICD-10-CM

## 2024-12-12 NOTE — Nursing Note (Signed)
 Patient arrived for BP check, in room 27. Patient was notified she needed to sit for a time period, and staff would be back to get BP. Patient had no needs at this time, had book at bedside and said she was going to read.    Marijo Holt, RN

## 2024-12-26 ENCOUNTER — Ambulatory Visit (HOSPITAL_BASED_OUTPATIENT_CLINIC_OR_DEPARTMENT_OTHER): Admitting: Family

## 2025-07-01 ENCOUNTER — Ambulatory Visit (HOSPITAL_BASED_OUTPATIENT_CLINIC_OR_DEPARTMENT_OTHER): Payer: Self-pay | Admitting: Internal Medicine
